# Patient Record
Sex: Female | Born: 1980 | Race: White | Hispanic: No | Smoking: Current every day smoker
Health system: Southern US, Community
[De-identification: ages and names within clinical notes are randomized; demographics above are authoritative.]

## PROBLEM LIST (undated history)

## (undated) DIAGNOSIS — R011 Cardiac murmur, unspecified: Secondary | ICD-10-CM

## (undated) DIAGNOSIS — I639 Cerebral infarction, unspecified: Secondary | ICD-10-CM

## (undated) DIAGNOSIS — I10 Essential (primary) hypertension: Secondary | ICD-10-CM

## (undated) HISTORY — DX: Cardiac murmur, unspecified: R01.1

## (undated) HISTORY — DX: Cerebral infarction, unspecified: I63.9

---

## 2004-07-22 ENCOUNTER — Emergency Department: Payer: Self-pay | Admitting: Unknown Physician Specialty

## 2006-10-25 ENCOUNTER — Emergency Department: Payer: Self-pay | Admitting: Internal Medicine

## 2006-11-27 ENCOUNTER — Emergency Department: Payer: Self-pay | Admitting: Emergency Medicine

## 2009-10-17 ENCOUNTER — Emergency Department: Payer: Self-pay | Admitting: Internal Medicine

## 2009-10-20 ENCOUNTER — Ambulatory Visit: Payer: Self-pay | Admitting: Gastroenterology

## 2011-04-02 ENCOUNTER — Emergency Department: Payer: Self-pay | Admitting: Emergency Medicine

## 2011-07-25 ENCOUNTER — Emergency Department: Payer: Self-pay | Admitting: Internal Medicine

## 2011-08-03 ENCOUNTER — Ambulatory Visit: Payer: Self-pay | Admitting: Specialist

## 2012-10-05 ENCOUNTER — Emergency Department: Payer: Medicaid Other | Admitting: Emergency Medicine

## 2012-10-05 LAB — CBC
MCH: 25.7 pg — ABNORMAL LOW (ref 26.0–34.0)
MCV: 79 fL — ABNORMAL LOW (ref 80–100)
Platelet: 306 10*3/uL (ref 150–440)
RDW: 15.2 % — ABNORMAL HIGH (ref 11.5–14.5)
WBC: 11.3 10*3/uL — ABNORMAL HIGH (ref 3.6–11.0)

## 2012-10-05 LAB — PRO B NATRIURETIC PEPTIDE: B-Type Natriuretic Peptide: 210 pg/mL — ABNORMAL HIGH (ref 0–125)

## 2012-10-05 LAB — BASIC METABOLIC PANEL
Anion Gap: 7 (ref 7–16)
Calcium, Total: 8.3 mg/dL — ABNORMAL LOW (ref 8.5–10.1)
Chloride: 105 mmol/L (ref 98–107)
Co2: 29 mmol/L (ref 21–32)
Creatinine: 0.82 mg/dL (ref 0.60–1.30)
EGFR (African American): >60

## 2015-03-21 ENCOUNTER — Emergency Department
Admission: EM | Admit: 2015-03-21 | Discharge: 2015-03-21 | Disposition: A | Discharge status: Home / Self Care | Payer: Medicaid Other | Attending: Emergency Medicine | Admitting: Emergency Medicine

## 2015-03-21 ENCOUNTER — Encounter: Payer: Self-pay | Admitting: Emergency Medicine

## 2015-03-21 ENCOUNTER — Emergency Department: Payer: Medicaid Other

## 2015-03-21 DIAGNOSIS — Z3202 Encounter for pregnancy test, result negative: Secondary | ICD-10-CM | POA: Diagnosis not present

## 2015-03-21 DIAGNOSIS — Z720 Tobacco use: Secondary | ICD-10-CM | POA: Insufficient documentation

## 2015-03-21 DIAGNOSIS — N12 Tubulo-interstitial nephritis, not specified as acute or chronic: Secondary | ICD-10-CM | POA: Insufficient documentation

## 2015-03-21 DIAGNOSIS — I10 Essential (primary) hypertension: Secondary | ICD-10-CM | POA: Insufficient documentation

## 2015-03-21 DIAGNOSIS — Z880 Allergy status to penicillin: Secondary | ICD-10-CM | POA: Insufficient documentation

## 2015-03-21 DIAGNOSIS — R109 Unspecified abdominal pain: Secondary | ICD-10-CM | POA: Diagnosis present

## 2015-03-21 HISTORY — DX: Essential (primary) hypertension: I10

## 2015-03-21 LAB — COMPREHENSIVE METABOLIC PANEL
ALT: 47 U/L (ref 14–54)
AST: 91 U/L — ABNORMAL HIGH (ref 15–41)
Albumin: 2.8 g/dL — ABNORMAL LOW (ref 3.5–5.0)
Alkaline Phosphatase: 104 U/L (ref 38–126)
Anion gap: 11 (ref 5–15)
BILIRUBIN TOTAL: 1.1 mg/dL (ref 0.3–1.2)
BUN: 26 mg/dL — ABNORMAL HIGH (ref 6–20)
CO2: 26 mmol/L (ref 22–32)
Calcium: 8.3 mg/dL — ABNORMAL LOW (ref 8.9–10.3)
Chloride: 93 mmol/L — ABNORMAL LOW (ref 101–111)
Creatinine, Ser: 1.42 mg/dL — ABNORMAL HIGH (ref 0.44–1.00)
GFR calc Af Amer: 56 mL/min — ABNORMAL LOW (ref >60)
GFR, EST NON AFRICAN AMERICAN: 48 mL/min — AB (ref >60)
Glucose, Bld: 82 mg/dL (ref 65–99)
Potassium: 2.3 mmol/L — CL (ref 3.5–5.1)
Sodium: 130 mmol/L — ABNORMAL LOW (ref 135–145)
TOTAL PROTEIN: 6.7 g/dL (ref 6.5–8.1)

## 2015-03-21 LAB — LIPASE, BLOOD: LIPASE: 23 U/L (ref 22–51)

## 2015-03-21 LAB — CBC WITH DIFFERENTIAL/PLATELET
BASOS ABS: 0 10*3/uL (ref 0–0.1)
Basophils Relative: 0 %
EOS PCT: 0 %
Eosinophils Absolute: 0 10*3/uL (ref 0–0.7)
HCT: 37.8 % (ref 35.0–47.0)
HEMOGLOBIN: 12.8 g/dL (ref 12.0–16.0)
Lymphocytes Relative: 9 %
Lymphs Abs: 1.1 10*3/uL (ref 1.0–3.6)
MCH: 32 pg (ref 26.0–34.0)
MCHC: 33.8 g/dL (ref 32.0–36.0)
MCV: 94.6 fL (ref 80.0–100.0)
MONOS PCT: 19 %
Monocytes Absolute: 2.2 10*3/uL — ABNORMAL HIGH (ref 0.2–0.9)
NEUTROS ABS: 8.5 10*3/uL — AB (ref 1.4–6.5)
Neutrophils Relative %: 72 %
Platelets: 139 10*3/uL — ABNORMAL LOW (ref 150–440)
RBC: 4 MIL/uL (ref 3.80–5.20)
RDW: 16.6 % — AB (ref 11.5–14.5)
WBC: 11.8 10*3/uL — ABNORMAL HIGH (ref 3.6–11.0)

## 2015-03-21 LAB — URINALYSIS COMPLETE WITH MICROSCOPIC (ARMC ONLY)
Bilirubin Urine: NEGATIVE
Glucose, UA: NEGATIVE mg/dL
Nitrite: NEGATIVE
Protein, ur: 30 mg/dL — AB
Specific Gravity, Urine: 1.016 (ref 1.005–1.030)
pH: 6 (ref 5.0–8.0)

## 2015-03-21 LAB — POCT PREGNANCY, URINE: Preg Test, Ur: NEGATIVE

## 2015-03-21 MED ORDER — POTASSIUM CHLORIDE 10 MEQ/100ML IV SOLN
10.0000 meq | Freq: Once | INTRAVENOUS | Status: AC
  Administered 2015-03-21: 10 meq via INTRAVENOUS
  Filled 2015-03-21: qty 100

## 2015-03-21 MED ORDER — OXYCODONE-ACETAMINOPHEN 5-325 MG PO TABS
2.0000 | ORAL_TABLET | Freq: Once | ORAL | Status: AC
  Administered 2015-03-21: 2 via ORAL

## 2015-03-21 MED ORDER — HYDROCODONE-ACETAMINOPHEN 5-325 MG PO TABS: 1.0000 | ORAL_TABLET | ORAL | Status: DC | PRN

## 2015-03-21 MED ORDER — POTASSIUM CHLORIDE CRYS ER 20 MEQ PO TBCR
EXTENDED_RELEASE_TABLET | ORAL | Status: AC
  Filled 2015-03-21: qty 2

## 2015-03-21 MED ORDER — SODIUM CHLORIDE 0.9 % IV BOLUS (SEPSIS)
1000.0000 mL | INTRAVENOUS | Status: AC
Start: 2015-03-21 — End: 2015-03-21
  Administered 2015-03-21: 1000 mL via INTRAVENOUS

## 2015-03-21 MED ORDER — OXYCODONE-ACETAMINOPHEN 5-325 MG PO TABS
ORAL_TABLET | ORAL | Status: AC
  Filled 2015-03-21: qty 2

## 2015-03-21 MED ORDER — POTASSIUM CHLORIDE CRYS ER 20 MEQ PO TBCR
40.0000 meq | EXTENDED_RELEASE_TABLET | Freq: Once | ORAL | Status: AC
  Administered 2015-03-21: 40 meq via ORAL

## 2015-03-21 MED ORDER — CEFTRIAXONE SODIUM IN DEXTROSE 40 MG/ML IV SOLN
2.0000 g | INTRAVENOUS | Status: AC
  Administered 2015-03-21: 2 g via INTRAVENOUS
  Filled 2015-03-21: qty 50

## 2015-03-21 MED ORDER — CEPHALEXIN 500 MG PO CAPS: 500.0000 mg | ORAL_CAPSULE | Freq: Three times a day (TID) | ORAL | Status: DC

## 2015-03-21 NOTE — Discharge Instructions (Signed)
Your workup today suggests that you have a urinary tract infection (UTI) which has spread to your right kidney.  Since the other antibiotic you were taking was unsuccessful, we switched you to another antibiotics and provided a gram of ceftriaxone IV by IV while in the emergency department.  Please take your antibiotic as prescribed and over-the-counter pain medication (Tylenol or Motrin) as needed, but no more than recommended on the label instructions.  Drink PLENTY of fluids.  Call your regular doctor to schedule the next available appointment to follow up on todays ED visit, or return immediately to the ED if your pain worsens, you have decreased urine production, develop fever, persistent vomiting, or other symptoms that concern you.   Pyelonephritis, Adult Pyelonephritis is a kidney infection. In general, there are 2 main types of pyelonephritis:  Infections that come on quickly without any warning (acute pyelonephritis).  Infections that persist for a long period of time (chronic pyelonephritis). CAUSES  Two main causes of pyelonephritis are:  Bacteria traveling from the bladder to the kidney. This is a problem especially in pregnant women. The urine in the bladder can become filled with bacteria from multiple causes, including:  Inflammation of the prostate gland (prostatitis).  Sexual intercourse in females.  Bladder infection (cystitis).  Bacteria traveling from the bloodstream to the tissue part of the kidney. Problems that may increase your risk of getting a kidney infection include:  Diabetes.  Kidney stones or bladder stones.  Cancer.  Catheters placed in the bladder.  Other abnormalities of the kidney or ureter. SYMPTOMS   Abdominal pain.  Pain in the side or flank area.  Fever.  Chills.  Upset stomach.  Blood in the urine (dark urine).  Frequent urination.  Strong or persistent urge to urinate.  Burning or stinging when urinating. DIAGNOSIS    Your caregiver may diagnose your kidney infection based on your symptoms. A urine sample may also be taken. TREATMENT  In general, treatment depends on how severe the infection is.   If the infection is mild and caught early, your caregiver may treat you with oral antibiotics and send you home.  If the infection is more severe, the bacteria may have gotten into the bloodstream. This will require intravenous (IV) antibiotics and a hospital stay. Symptoms may include:  High fever.  Severe flank pain.  Shaking chills.  Even after a hospital stay, your caregiver may require you to be on oral antibiotics for a period of time.  Other treatments may be required depending upon the cause of the infection. HOME CARE INSTRUCTIONS   Take your antibiotics as directed. Finish them even if you start to feel better.  Make an appointment to have your urine checked to make sure the infection is gone.  Drink enough fluids to keep your urine clear or pale yellow.  Take medicines for the bladder if you have urgency and frequency of urination as directed by your caregiver. SEEK IMMEDIATE MEDICAL CARE IF:   You have a fever or persistent symptoms for more than 2-3 days.  You have a fever and your symptoms suddenly get worse.  You are unable to take your antibiotics or fluids.  You develop shaking chills.  You experience extreme weakness or fainting.  There is no improvement after 2 days of treatment. MAKE SURE YOU:  Understand these instructions.  Will watch your condition.  Will get help right away if you are not doing well or get worse. Document Released: 10/01/2005 Document Revised: 04/01/2012 Document  Reviewed: 03/07/2011 ExitCare Patient Information 2015 [place]. This information is not intended to replace advice given to you by your health care provider. Make sure you discuss any questions you have with your health care provider.

## 2015-03-21 NOTE — ED Notes (Signed)
Pt ambulating independently w/ steady gait on d/c in no acute distress, A&Ox4. D/c instructions reviewed w/ pt and family - pt and family deny any further questions or concerns at present. Rx given x1  

## 2015-03-21 NOTE — ED Notes (Signed)
States had urine positive for infection 3 days, fevers at home

## 2015-03-21 NOTE — ED Notes (Signed)
Patient states she has right flank pain and was seen at her PCP on Friday and given antibiotics to treat a UTI.

## 2015-03-21 NOTE — ED Provider Notes (Signed)
[facility] Emergency Department Provider Note  ____________________________________________  Time seen: Approximately 6:11 PM  I have reviewed the triage vital signs and the nursing notes.   HISTORY  Chief Complaint Flank Pain    HPI Amy Frye is a 34 y.o. female with a history of about 4-5 days of right flank pain and subjective fever and chills for about the same time period.  The pain started in her right flank but also radiates to her suprapubic region.She does not specifically endorse dysuria.  She was seen by her primary care doctor 3 days ago and started on Cipro for a urinary tract infection.  She has felt no better since then and describes that her whole body hurts now and not just her flank and pelvis as described previously.  She describes the pain as severe and it waxes and wanes but never goes away completely.  She denies nausea, vomiting, chest pain, shortness of breath, or other abdominal pain.  She has not seen any blood in her urine.   Past Medical History  Diagnosis Date  . Hypertension     There are no active problems to display for this patient.   No past surgical history on file.    Allergies Penicillins and Sulfa antibiotics  (unknown reaction)   No family history on file.  Social History History  Substance Use Topics  . Smoking status: Current Every Day Smoker -- 1.00 packs/day    Types: Cigarettes  . Smokeless tobacco: Not on file  . Alcohol Use: 6.0 oz/week    10 Standard drinks or equivalent per week    Review of Systems Constitutional: Subjective fever/chills.  Describes pain all over her body. Eyes: No visual changes. ENT: No sore throat. Cardiovascular: Denies chest pain. Respiratory: Denies shortness of breath. Gastrointestinal: Suprapubic pain radiated from the right flank.  No nausea, no vomiting.  No diarrhea.  No constipation. Genitourinary: Negative for dysuria. Musculoskeletal: Right flank  pain Skin: Negative for rash. Neurological: Negative for headaches, focal weakness or numbness.  10-point ROS otherwise negative.  ____________________________________________   PHYSICAL EXAM:  VITAL SIGNS: ED Triage Vitals  Enc Vitals Group     BP 03/21/15 1638 117/70 mmHg     Pulse Rate 03/21/15 1638 100     Resp 03/21/15 1638 20     Temp 03/21/15 1638 98.6 F (37 C)     Temp Source 03/21/15 1638 Oral     SpO2 03/21/15 1638 100 %     Weight 03/21/15 1638 140 lb (63.504 kg)     Height 03/21/15 1638 5\' 8"  (1.727 m)     Head Cir --      Peak Flow --      Pain Score 03/21/15 1640 6     Pain Loc --      Pain Edu? --      Excl. in GC? --     Constitutional: Alert and oriented. Well appearing and in no acute distress, but appears uncomfortable Eyes: Conjunctivae are normal. PERRL. EOMI. Head: Atraumatic. Nose: No congestion/rhinnorhea. Mouth/Throat: Mucous membranes are moist.  Oropharynx non-erythematous. Neck: No stridor.   Cardiovascular: Borderline tachycardia, regular rhythm. Grossly normal heart sounds.  Good peripheral circulation. Respiratory: Normal respiratory effort.  No retractions. Lungs CTAB. Gastrointestinal: Soft and nontender. No distention. No abdominal bruits.  Moderate right CVA tenderness. Musculoskeletal: No lower extremity tenderness nor edema.  No joint effusions. Neurologic:  Normal speech and language. No gross focal neurologic deficits are appreciated. Speech is  normal. No gait instability. Skin:  Skin is warm, dry and intact. No rash noted. Psychiatric: Mood and affect are normal. Speech and behavior are normal.  ____________________________________________   LABS (all labs ordered are listed, but only abnormal results are displayed)  Labs Reviewed  CBC WITH DIFFERENTIAL/PLATELET - Abnormal; Notable for the following:    WBC 11.8 (*)    RDW 16.6 (*)    Platelets 139 (*)    Neutro Abs 8.5 (*)    Monocytes Absolute 2.2 (*)    All other  components within normal limits  COMPREHENSIVE METABOLIC PANEL - Abnormal; Notable for the following:    Sodium 130 (*)    Potassium 2.3 (*)    Chloride 93 (*)    BUN 26 (*)    Creatinine, Ser 1.42 (*)    Calcium 8.3 (*)    Albumin 2.8 (*)    AST 91 (*)    GFR calc non Af Amer 48 (*)    GFR calc Af Amer 56 (*)    All other components within normal limits  URINALYSIS COMPLETEWITH MICROSCOPIC ([facility] ONLY) - Abnormal; Notable for the following:    Color, Urine YELLOW (*)    APPearance HAZY (*)    Ketones, ur 1+ (*)    Hgb urine dipstick 1+ (*)    Protein, ur 30 (*)    Leukocytes, UA TRACE (*)    Bacteria, UA RARE (*)    Squamous Epithelial / LPF 6-30 (*)    All other components within normal limits  URINE CULTURE  LIPASE, BLOOD  POC URINE PREG, ED  POCT PREGNANCY, URINE   ____________________________________________  EKG  ED ECG REPORT I, Matalynn Graff, the attending physician, personally viewed and interpreted this ECG.   Date: 03/21/2015  EKG Time: 18:07  Rate: 90  Rhythm: normal sinus rhythm  Axis: Rightward axis  Intervals:none  ST&T Change: Approximately 1 cm of ST depression in lead V6, otherwise non-specific changes  ____________________________________________  RADIOLOGY  Ct Abdomen Pelvis Wo Contrast  03/21/2015   CLINICAL DATA:  Right-sided flank pain for 5 days  EXAM: CT ABDOMEN AND PELVIS WITHOUT CONTRAST  TECHNIQUE: Multidetector CT imaging of the abdomen and pelvis was performed following the standard protocol without IV contrast.  COMPARISON:  None.  FINDINGS: Lung bases are free of acute infiltrate or sizable effusion.  The liver, gallbladder, spleen, adrenal glands and pancreas are within normal limits. The left kidney shows some minimal fullness of the collecting system although the left ureter is within normal limits. Right kidney is enlarged and demonstrates perinephric stranding. Prominence of the collecting system and proximal ureter is seen. No  definitive calculus is identified. These changes may be related to edema from recently passed stone although the possibility of pyelonephritis would deserve consideration.  The appendix is well visualized and within normal limits. The bladder is well distended. No pelvic mass lesion is seen. The bony structures show no acute abnormality. The uterus and ovaries appear within normal limits.  IMPRESSION: Mildly enlarged right kidney with perinephric stranding and prominence of the collecting system proximally. No definitive calculus is seen. These changes may be related to a poorly calcified stone or edema from recently passed stone. Alternatively pyelonephritis could present in this manner as well.   Electronically Signed   By: Alcide Clever M.D.   On: 03/21/2015 18:54    ____________________________________________   INITIAL IMPRESSION / ASSESSMENT AND PLAN / ED COURSE  Pertinent labs & imaging results that were available during  my care of the patient were reviewed by me and considered in my medical decision making (see chart for details).  Given local resistance patterns, I suspect the patient has pollen of nephritis that was not covered by the outpatient Cipro.  I obtained a noncontrast CT abdomen and pelvis to evaluate for Prilosec but also to evaluate for possible stone.  There is no evidence of kidney stone or obstruction but the stranding and inflammation suggests pyelonephritis.  The patient is hemodynamically stable.  I will treat her with ceftriaxone 2 g IV and will discharge with a prescription for Keflex and Norco.  I advised close outpatient follow-up.  I also sent a urine culture.  ____________________________________________  FINAL CLINICAL IMPRESSION(S) / ED DIAGNOSES  Final diagnoses:  Pyelonephritis      NEW MEDICATIONS STARTED DURING THIS VISIT:  New Prescriptions   CEPHALEXIN (KEFLEX) 500 MG CAPSULE    Take 1 capsule (500 mg total) by mouth 3 (three) times daily.    HYDROCODONE-ACETAMINOPHEN (NORCO/VICODIN) 5-325 MG PER TABLET    Take 1-2 tablets by mouth every 4 (four) hours as needed for moderate pain.     Loleta Rose, MD 03/21/15 2037

## 2015-03-24 LAB — URINE CULTURE: Special Requests: NORMAL

## 2015-10-16 HISTORY — PX: BREAST SURGERY: SHX581

## 2017-07-25 DIAGNOSIS — D2261 Melanocytic nevi of right upper limb, including shoulder: Secondary | ICD-10-CM | POA: Diagnosis not present

## 2017-07-25 DIAGNOSIS — D485 Neoplasm of uncertain behavior of skin: Secondary | ICD-10-CM | POA: Diagnosis not present

## 2017-07-25 DIAGNOSIS — D2262 Melanocytic nevi of left upper limb, including shoulder: Secondary | ICD-10-CM | POA: Diagnosis not present

## 2017-07-25 DIAGNOSIS — D225 Melanocytic nevi of trunk: Secondary | ICD-10-CM | POA: Diagnosis not present

## 2017-07-25 DIAGNOSIS — L812 Freckles: Secondary | ICD-10-CM | POA: Diagnosis not present

## 2017-07-25 DIAGNOSIS — L700 Acne vulgaris: Secondary | ICD-10-CM | POA: Diagnosis not present

## 2018-08-27 DIAGNOSIS — L700 Acne vulgaris: Secondary | ICD-10-CM | POA: Diagnosis not present

## 2018-08-27 DIAGNOSIS — R21 Rash and other nonspecific skin eruption: Secondary | ICD-10-CM | POA: Diagnosis not present

## 2018-08-27 DIAGNOSIS — L309 Dermatitis, unspecified: Secondary | ICD-10-CM | POA: Diagnosis not present

## 2018-08-29 ENCOUNTER — Telehealth: Payer: Self-pay | Admitting: Obstetrics & Gynecology

## 2018-08-29 NOTE — Telephone Encounter (Signed)
Patient hasn't been seen in several years but had a standing prescription for something to take for cold sores.  Patient needs new prescription.

## 2018-09-01 ENCOUNTER — Other Ambulatory Visit: Payer: Self-pay | Admitting: Obstetrics and Gynecology

## 2018-09-01 DIAGNOSIS — B009 Herpesviral infection, unspecified: Secondary | ICD-10-CM

## 2018-09-01 MED ORDER — VALACYCLOVIR HCL 1 G PO TABS
1000.0000 mg | ORAL_TABLET | Freq: Every day | ORAL | 6 refills | Status: DC
Start: 2018-09-01 — End: 2018-11-20

## 2018-09-01 NOTE — Telephone Encounter (Signed)
[facility]

## 2018-09-01 NOTE — Telephone Encounter (Signed)
Pt needs an appointment before new Rx can be sent in. Please schedule does not have to be work in and not emergent.

## 2018-09-01 NOTE — Telephone Encounter (Signed)
Pt needs refill on medication for really bad cold sores.  She has scheduled appt for 12/23.  Can rx please be sent in?  Cannot wail a month like this.  [phone]

## 2018-09-01 NOTE — Telephone Encounter (Signed)
Can you send in a medication since rph is not in the office?

## 2018-09-01 NOTE — Telephone Encounter (Signed)
Completed, patient notified.

## 2018-09-03 ENCOUNTER — Ambulatory Visit: Payer: Self-pay | Admitting: Obstetrics and Gynecology

## 2018-09-16 DIAGNOSIS — Z889 Allergy status to unspecified drugs, medicaments and biological substances status: Secondary | ICD-10-CM | POA: Diagnosis not present

## 2018-09-16 DIAGNOSIS — I10 Essential (primary) hypertension: Secondary | ICD-10-CM | POA: Diagnosis not present

## 2018-09-16 DIAGNOSIS — R51 Headache: Secondary | ICD-10-CM | POA: Diagnosis not present

## 2018-09-16 DIAGNOSIS — J309 Allergic rhinitis, unspecified: Secondary | ICD-10-CM | POA: Diagnosis not present

## 2018-09-17 DIAGNOSIS — L700 Acne vulgaris: Secondary | ICD-10-CM | POA: Diagnosis not present

## 2018-09-17 DIAGNOSIS — S81809S Unspecified open wound, unspecified lower leg, sequela: Secondary | ICD-10-CM | POA: Diagnosis not present

## 2018-09-26 DIAGNOSIS — L700 Acne vulgaris: Secondary | ICD-10-CM | POA: Diagnosis not present

## 2018-09-26 DIAGNOSIS — Z79899 Other long term (current) drug therapy: Secondary | ICD-10-CM | POA: Diagnosis not present

## 2018-09-30 DIAGNOSIS — L700 Acne vulgaris: Secondary | ICD-10-CM | POA: Diagnosis not present

## 2018-10-06 ENCOUNTER — Ambulatory Visit: Payer: Medicaid Other | Admitting: Obstetrics & Gynecology

## 2018-10-17 ENCOUNTER — Ambulatory Visit: Payer: Medicaid Other | Admitting: Obstetrics & Gynecology

## 2018-10-21 ENCOUNTER — Ambulatory Visit: Payer: Medicaid Other | Admitting: Obstetrics and Gynecology

## 2018-10-21 ENCOUNTER — Ambulatory Visit: Payer: Medicaid Other | Admitting: Maternal Newborn

## 2018-10-22 ENCOUNTER — Ambulatory Visit: Payer: Medicaid Other | Admitting: Obstetrics and Gynecology

## 2018-10-24 ENCOUNTER — Encounter: Payer: Self-pay | Admitting: Advanced Practice Midwife

## 2018-10-24 ENCOUNTER — Other Ambulatory Visit (HOSPITAL_COMMUNITY)
Admission: RE | Admit: 2018-10-24 | Discharge: 2018-10-24 | Disposition: A | Discharge status: Home / Self Care | Payer: Medicaid Other | Source: Ambulatory Visit | Attending: Advanced Practice Midwife | Admitting: Advanced Practice Midwife

## 2018-10-24 ENCOUNTER — Ambulatory Visit: Payer: Medicaid Other | Admitting: Advanced Practice Midwife

## 2018-10-24 ENCOUNTER — Ambulatory Visit (INDEPENDENT_AMBULATORY_CARE_PROVIDER_SITE_OTHER): Payer: Medicaid Other | Admitting: Advanced Practice Midwife

## 2018-10-24 VITALS — BP 138/92 | HR 112 | Ht 68.0 in | Wt 172.0 lb

## 2018-10-24 DIAGNOSIS — Z01419 Encounter for gynecological examination (general) (routine) without abnormal findings: Secondary | ICD-10-CM | POA: Diagnosis present

## 2018-10-24 DIAGNOSIS — Z30430 Encounter for insertion of intrauterine contraceptive device: Secondary | ICD-10-CM | POA: Diagnosis not present

## 2018-10-24 DIAGNOSIS — Z113 Encounter for screening for infections with a predominantly sexual mode of transmission: Secondary | ICD-10-CM | POA: Insufficient documentation

## 2018-10-24 DIAGNOSIS — Z124 Encounter for screening for malignant neoplasm of cervix: Secondary | ICD-10-CM

## 2018-10-24 DIAGNOSIS — Z0000 Encounter for general adult medical examination without abnormal findings: Secondary | ICD-10-CM

## 2018-10-24 NOTE — Progress Notes (Signed)
Patient ID: Amy Frye, female   DOB: 1981/02/24, 38 y.o.   MRN: [id number]     Gynecology Annual Exam   PCP: Margaretann Loveless, MD  Chief Complaint:  Chief Complaint  Patient presents with  . Gynecologic Exam    Discuss new OCP    History of Present Illness: Patient is a 38 y.o. G1P1 presents for annual exam. The patient has complaint today of possible yeast infection. She denies itching or irritation but recently had an increased amount of thin white discharge. She denies odor or s/s of uti. She is going to start a new acne medication and has been informed that she needs a reliable form of birth control. Discussed the options given that she is a smoker and has had a stroke per her report. She has decided that she would like Mirena IUD.    LMP: Patient's last menstrual period was 10/10/2018. Average Interval: regular, every 2 months  Duration of flow: 6 days Heavy Menses: no Clots: no Intermenstrual Bleeding: some spotting Postcoital Bleeding: no Dysmenorrhea: no  The patient is not currently sexually active. She currently uses oral progesterone-only contraceptive for contraception. She denies dyspareunia.  The patient does perform self breast exams. She had breast augmentation surgery in the past 2 years. There is no notable family history of breast or ovarian cancer in her family.  The patient wears seatbelts: yes.   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  She admits trying to eat healthy. She drinks some water and also energy drinks that are artificially sweetened.   The patient denies current symptoms of depression.    Review of Systems: Review of Systems  Constitutional: Negative.   HENT: Negative.   Eyes: Negative.   Respiratory: Negative.   Cardiovascular: Negative.   Gastrointestinal: Negative.   Genitourinary:       Discharge   Musculoskeletal: Negative.   Skin: Negative.   Neurological: Negative.   Endo/Heme/Allergies:  Negative.   Psychiatric/Behavioral: Negative.     Past Medical History:  Past Medical History:  Diagnosis Date  . Hypertension     Past Surgical History:  Past Surgical History:  Procedure Laterality Date  . BREAST SURGERY  2017   Lift/ Enhancement bilateral  . CESAREAN SECTION  04/10/2003    Gynecologic History:  Patient's last menstrual period was 10/10/2018. Contraception: oral progesterone-only contraceptive Last Pap: 2013 Results were: no abnormalities  Obstetric History: G1P1  Family History:  Family History  Adopted: Yes    Social History:  Social History   Socioeconomic History  . Marital status: Single    Spouse name: Not on file  . Number of children: Not on file  . Years of education: Not on file  . Highest education level: Not on file  Occupational History  . Not on file  Social Needs  . Financial resource strain: Not on file  . Food insecurity:    Worry: Not on file    Inability: Not on file  . Transportation needs:    Medical: Not on file    Non-medical: Not on file  Tobacco Use  . Smoking status: Current Every Day Smoker    Packs/day: 1.00    Types: Cigarettes  . Smokeless tobacco: Never Used  Substance and Sexual Activity  . Alcohol use: Yes    Alcohol/week: 10.0 standard drinks    Types: 10 Standard drinks or equivalent per week    Comment: occ  . Drug use: Not Currently  .  Sexual activity: Not Currently    Partners: Male    Birth control/protection: Pill  Lifestyle  . Physical activity:    Days per week: Not on file    Minutes per session: Not on file  . Stress: Not on file  Relationships  . Social connections:    Talks on phone: Not on file    Gets together: Not on file    Attends religious service: Not on file    Active member of club or organization: Not on file    Attends meetings of clubs or organizations: Not on file    Relationship status: Not on file  . Intimate partner violence:    Fear of current or ex partner:  Not on file    Emotionally abused: Not on file    Physically abused: Not on file    Forced sexual activity: Not on file  Other Topics Concern  . Not on file  Social History Narrative  . Not on file    Allergies:  Allergies  Allergen Reactions  . Penicillins Other (See Comments)    unknown  . Sulfa Antibiotics Swelling    Medications: Prior to Admission medications   Medication Sig Start Date End Date Taking? Authorizing Provider  doxycycline (VIBRA-TABS) 100 MG tablet Take by mouth.   Yes [provider]  hydrochlorothiazide (MICROZIDE) 12.5 MG capsule Take 12.5 mg by mouth daily.   Yes [provider]    Physical Exam Vitals: Blood pressure (!) 138/92, pulse (!) 112, height 5\' 8"  (1.727 m), weight 172 lb (78 kg), last menstrual period 10/10/2018.  General: NAD HEENT: normocephalic, anicteric Thyroid: no enlargement, no palpable nodules Pulmonary: No increased work of breathing, CTAB Cardiovascular: RRR, distal pulses 2+ Breast: Breast symmetrical, no tenderness, no palpable nodules or masses, no skin or nipple retraction present, no nipple discharge.  No axillary or supraclavicular lymphadenopathy. Abdomen: NABS, soft, non-tender, non-distended.  Umbilicus without lesions.  No hepatomegaly, splenomegaly or masses palpable. No evidence of hernia  Genitourinary:  External: Normal external female genitalia.  Normal urethral meatus, normal Bartholin's and Skene's glands.    Vagina: Normal vaginal mucosa, no evidence of prolapse, no discharge seen.    Cervix: Grossly normal in appearance, bleeding seen  Uterus: Non-enlarged, mobile, normal contour.  No CMT  Adnexa: ovaries non-enlarged, no adnexal masses  Rectal: deferred  Lymphatic: no evidence of inguinal lymphadenopathy Extremities: no edema, erythema, or tenderness Neurologic: Grossly intact Psychiatric: mood appropriate, affect full    Assessment: 38 y.o. G1P1 routine annual exam  Plan: Problem  List Items Addressed This Visit    None    Visit Diagnoses    Well woman exam with routine gynecological exam    -  Primary   Relevant Orders   Cytology - PAP   STD Panel   Screen for sexually transmitted diseases       Relevant Orders   Cytology - PAP   STD Panel   Cervical cancer screening       Relevant Orders   Cytology - PAP   Encounter for insertion of mirena IUD          1) STI screening  was offered and accepted  2)  ASCCP guidelines and rational discussed.  Patient opts for every 3 years screening interval  3) Contraception - the patient is currently using  oral progesterone-only contraceptive.  She is interested in changing to Mirena IUD  4) Routine healthcare maintenance including cholesterol, diabetes screening discussed Declines  5) Return  in 4 weeks (on 11/21/2018) for IUD string check 4 weeks and lab only visit for STD panel   Tresea Mall, CNM [facility], [facility] 10/24/2018, 4:45 PM     GYNECOLOGY OFFICE PROCEDURE NOTE  Amy Frye is a 38 y.o. G1P1 here for Mirena IUD insertion. No GYN concerns.  Last pap smear was 2013 and was normal.  The patient is currently using Camila/abstinence for contraception and her LMP is Patient's last menstrual period was 10/10/2018.Marland Kitchen  The indication for her IUD is contraception/cycle control.  IUD Insertion Procedure Note Patient identified, informed consent performed, consent signed.   Discussed risks of irregular bleeding, cramping, infection, malpositioning, expulsion or uterine perforation of the IUD (1:1000 placements)  which may require further procedure such as laparoscopy.  IUD while effective at preventing pregnancy do not prevent transmission of sexually transmitted diseases and use of barrier methods for this purpose was discussed. Time out was performed.  Urine pregnancy test negative.  Speculum placed in the vagina.  Cervix visualized.  Cleaned with Betadine x 2.  Grasped anteriorly with  a single tooth tenaculum.  Uterus sounded to 7.5 cm. IUD placed per manufacturer's recommendations.  Strings trimmed to 3 cm. Tenaculum was removed, good hemostasis noted.  Patient tolerated procedure well.   Patient was given post-procedure instructions.  She was advised to have backup contraception for one week.  Patient was also asked to check IUD strings periodically and follow up in 4-6 weeks for IUD check.  IUD insertion CPT 58300,  Skyla [id number] Mirena [id number] Liletta [id number] Paraguard [id number] Rutha Bouchard [phone] Modifer 25, plus Modifer 79 is done during a global billing visit    Tresea Mall, CNM [facility], [facility]

## 2018-10-24 NOTE — Patient Instructions (Addendum)
Mediterranean Diet A Mediterranean diet refers to food and lifestyle choices that are based on the traditions of countries located on the [employer]. This way of eating has been shown to help prevent certain conditions and improve outcomes for people who have chronic diseases, like kidney disease and heart disease. What are tips for following this plan? Lifestyle  Cook and eat meals together with your family, when possible.  Drink enough fluid to keep your urine clear or pale yellow.  Be physically active every day. This includes: ? Aerobic exercise like running or swimming. ? Leisure activities like gardening, walking, or housework.  Get 7-8 hours of sleep each night.  If recommended by your health care provider, drink red wine in moderation. This means 1 glass a day for nonpregnant women and 2 glasses a day for men. A glass of wine equals 5 oz (150 mL). Reading food labels   Check the serving size of packaged foods. For foods such as rice and pasta, the serving size refers to the amount of cooked product, not dry.  Check the total fat in packaged foods. Avoid foods that have saturated fat or trans fats.  Check the ingredients list for added sugars, such as corn syrup. Shopping  At the grocery store, buy most of your food from the areas near the walls of the store. This includes: ? Fresh fruits and vegetables (produce). ? Grains, beans, nuts, and seeds. Some of these may be available in unpackaged forms or large amounts (in bulk). ? Fresh seafood. ? Poultry and eggs. ? Low-fat dairy products.  Buy whole ingredients instead of prepackaged foods.  Buy fresh fruits and vegetables in-season from local farmers markets.  Buy frozen fruits and vegetables in resealable bags.  If you do not have access to quality fresh seafood, buy precooked frozen shrimp or canned fish, such as tuna, salmon, or sardines.  Buy small amounts of raw or cooked vegetables, salads, or olives from  the deli or salad bar at your store.  Stock your pantry so you always have certain foods on hand, such as olive oil, canned tuna, canned tomatoes, rice, pasta, and beans. Cooking  Cook foods with extra-virgin olive oil instead of using butter or other vegetable oils.  Have meat as a side dish, and have vegetables or grains as your main dish. This means having meat in small portions or adding small amounts of meat to foods like pasta or stew.  Use beans or vegetables instead of meat in common dishes like chili or lasagna.  Experiment with different cooking methods. Try roasting or broiling vegetables instead of steaming or sauteing them.  Add frozen vegetables to soups, stews, pasta, or rice.  Add nuts or seeds for added healthy fat at each meal. You can add these to yogurt, salads, or vegetable dishes.  Marinate fish or vegetables using olive oil, lemon juice, garlic, and fresh herbs. Meal planning   Plan to eat 1 vegetarian meal one day each week. Try to work up to 2 vegetarian meals, if possible.  Eat seafood 2 or more times a week.  Have healthy snacks readily available, such as: ? Vegetable sticks with hummus. ? [place] yogurt. ? Fruit and nut trail mix.  Eat balanced meals throughout the week. This includes: ? Fruit: 2-3 servings a day ? Vegetables: 4-5 servings a day ? Low-fat dairy: 2 servings a day ? Fish, poultry, or lean meat: 1 serving a day ? Beans and legumes: 2 or more servings a week ?  Nuts and seeds: 1-2 servings a day ? Whole grains: 6-8 servings a day ? Extra-virgin olive oil: 3-4 servings a day  Limit red meat and sweets to only a few servings a month What are my food choices?  Mediterranean diet ? Recommended ? Grains: Whole-grain pasta. Brown rice. Bulgar wheat. Polenta. Couscous. Whole-wheat bread. [place]. ? Vegetables: Artichokes. Beets. Broccoli. Cabbage. Carrots. Eggplant. Green beans. Chard. Kale. Spinach. Onions. Leeks. Peas. Squash.  Tomatoes. Peppers. Radishes. ? Fruits: Apples. Apricots. Avocado. Berries. Bananas. Cherries. Dates. Figs. Grapes. Lemons. Melon. Oranges. Peaches. Plums. Pomegranate. ? Meats and other protein foods: Beans. Almonds. Sunflower seeds. Pine nuts. Peanuts. [place]. Salmon. Scallops. Shrimp. [place]. Tilapia. Clams. Oysters. Eggs. ? Dairy: Low-fat milk. Cheese. Greek yogurt. ? Beverages: Water. Red wine. Herbal tea. ? Fats and oils: Extra virgin olive oil. Avocado oil. Grape seed oil. ? Sweets and desserts: [place] yogurt with honey. Baked apples. Poached pears. Trail mix. ? Seasoning and other foods: Basil. Cilantro. Coriander. Cumin. Mint. Parsley. Sage. Rosemary. Tarragon. Garlic. Oregano. Thyme. Pepper. Balsalmic vinegar. Tahini. Hummus. Tomato sauce. Olives. Mushrooms. ? Limit these ? Grains: Prepackaged pasta or rice dishes. Prepackaged cereal with added sugar. ? Vegetables: Deep fried potatoes (french fries). ? Fruits: Fruit canned in syrup. ? Meats and other protein foods: Beef. Pork. Lamb. Poultry with skin. Hot dogs. Berniece Salines. ? Dairy: Ice cream. Sour cream. Whole milk. ? Beverages: Juice. Sugar-sweetened soft drinks. Beer. Liquor and spirits. ? Fats and oils: Butter. Canola oil. Vegetable oil. Beef fat (tallow). Lard. ? Sweets and desserts: Cookies. Cakes. Pies. Candy. ? Seasoning and other foods: Mayonnaise. Premade sauces and marinades. ? The items listed may not be a complete list. Talk with your dietitian about what dietary choices are right for you. Summary  The Mediterranean diet includes both food and lifestyle choices.  Eat a variety of fresh fruits and vegetables, beans, nuts, seeds, and whole grains.  Limit the amount of red meat and sweets that you eat.  Talk with your health care provider about whether it is safe for you to drink red wine in moderation. This means 1 glass a day for nonpregnant women and 2 glasses a day for men. A glass of wine equals 5 oz (150 mL). This information  is not intended to replace advice given to you by your health care provider. Make sure you discuss any questions you have with your health care provider. Document Released: 05/24/2016 Document Revised: 06/26/2016 Document Reviewed: 05/24/2016 Elsevier Interactive Patient Education  2019 [facility]) Exercise Recommendation  Being physically active is important to prevent heart disease and stroke, the nation's No. 1and No. 5killers. To improve overall cardiovascular health, we suggest at least 150 minutes per week of moderate exercise or 75 minutes per week of vigorous exercise (or a combination of moderate and vigorous activity). Thirty minutes a day, five times a week is an easy goal to remember. You will also experience benefits even if you divide your time into two or three segments of 10 to 15 minutes per day.  For people who would benefit from lowering their blood pressure or cholesterol, we recommend 40 minutes of aerobic exercise of moderate to vigorous intensity three to four times a week to lower the risk for heart attack and stroke.  Physical activity is anything that makes you move your body and burn calories.  This includes things like climbing stairs or playing sports. Aerobic exercises benefit your heart, and include walking, jogging, swimming or biking. Strength  and stretching exercises are best for overall stamina and flexibility.  The simplest, positive change you can make to effectively improve your heart health is to start walking. It's enjoyable, free, easy, social and great exercise. A walking program is flexible and boasts high success rates because people can stick with it. It's easy for walking to become a regular and satisfying part of life.   For Overall Cardiovascular Health:  At least 30 minutes of moderate-intensity aerobic activity at least 5 days per week for a total of 150  OR   At least 25 minutes of vigorous aerobic activity  at least 3 days per week for a total of 75 minutes; or a combination of moderate- and vigorous-intensity aerobic activity  AND   Moderate- to high-intensity muscle-strengthening activity at least 2 days per week for additional health benefits.  For Lowering Blood Pressure and Cholesterol  An average 40 minutes of moderate- to vigorous-intensity aerobic activity 3 or 4 times per week  What if I can't make it to the time goal? Something is always better than nothing! And everyone has to start somewhere. Even if you've been sedentary for years, today is the day you can begin to make healthy changes in your life. If you don't think you'll make it for 30 or 40 minutes, set a reachable goal for today. You can work up toward your overall goal by increasing your time as you get stronger. Don't let all-or-nothing thinking rob you of doing what you can every day.  Source:http://www.heart.org     Preventive Care 18-39 Years, Female Preventive care refers to lifestyle choices and visits with your health care provider that can promote health and wellness. What does preventive care include?   A yearly physical exam. This is also called an annual well check.  Dental exams once or twice a year.  Routine eye exams. Ask your health care provider how often you should have your eyes checked.  Personal lifestyle choices, including: ? Daily care of your teeth and gums. ? Regular physical activity. ? Eating a healthy diet. ? Avoiding tobacco and drug use. ? Limiting alcohol use. ? Practicing safe sex. ? Taking vitamin and mineral supplements as recommended by your health care provider. What happens during an annual well check? The services and screenings done by your health care provider during your annual well check will depend on your age, overall health, lifestyle risk factors, and family history of disease. Counseling Your health care provider may ask you questions about your:  Alcohol  use.  Tobacco use.  Drug use.  Emotional well-being.  Home and relationship well-being.  Sexual activity.  Eating habits.  Work and work Statistician.  Method of birth control.  Menstrual cycle.  Pregnancy history. Screening You may have the following tests or measurements:  Height, weight, and BMI.  Diabetes screening. This is done by checking your blood sugar (glucose) after you have not eaten for a while (fasting).  Blood pressure.  Lipid and cholesterol levels. These may be checked every 5 years starting at age 49.  Skin check.  Hepatitis C blood test.  Hepatitis B blood test.  Sexually transmitted disease (STD) testing.  BRCA-related cancer screening. This may be done if you have a family history of breast, ovarian, tubal, or peritoneal cancers.  Pelvic exam and Pap test. This may be done every 3 years starting at age 1. Starting at age 17, this may be done every 5 years if you have a Pap test in combination  with an HPV test. Discuss your test results, treatment options, and if necessary, the need for more tests with your health care provider. Vaccines Your health care provider may recommend certain vaccines, such as:  Influenza vaccine. This is recommended every year.  Tetanus, diphtheria, and acellular pertussis (Tdap, Td) vaccine. You may need a Td booster every 10 years.  Varicella vaccine. You may need this if you have not been vaccinated.  HPV vaccine. If you are 56 or younger, you may need three doses over 6 months.  Measles, mumps, and rubella (MMR) vaccine. You may need at least one dose of MMR. You may also need a second dose.  Pneumococcal 13-valent conjugate (PCV13) vaccine. You may need this if you have certain conditions and were not previously vaccinated.  Pneumococcal polysaccharide (PPSV23) vaccine. You may need one or two doses if you smoke cigarettes or if you have certain conditions.  Meningococcal vaccine. One dose is recommended  if you are age 70-21 years and a first-year college student living in a residence hall, or if you have one of several medical conditions. You may also need additional booster doses.  Hepatitis A vaccine. You may need this if you have certain conditions or if you travel or work in places where you may be exposed to hepatitis A.  Hepatitis B vaccine. You may need this if you have certain conditions or if you travel or work in places where you may be exposed to hepatitis B.  Haemophilus influenzae type b (Hib) vaccine. You may need this if you have certain risk factors. Talk to your health care provider about which screenings and vaccines you need and how often you need them. This information is not intended to replace advice given to you by your health care provider. Make sure you discuss any questions you have with your health care provider. Document Released: 11/27/2001 Document Revised: 05/14/2017 Document Reviewed: 08/02/2015 Elsevier Interactive Patient Education  2019 [employer].

## 2018-10-27 ENCOUNTER — Encounter: Payer: Self-pay | Admitting: Obstetrics and Gynecology

## 2018-10-27 ENCOUNTER — Ambulatory Visit (INDEPENDENT_AMBULATORY_CARE_PROVIDER_SITE_OTHER): Payer: Medicaid Other | Admitting: Obstetrics and Gynecology

## 2018-10-27 ENCOUNTER — Other Ambulatory Visit: Payer: Medicaid Other

## 2018-10-27 VITALS — BP 130/90 | HR 103 | Ht 68.0 in | Wt 170.0 lb

## 2018-10-27 DIAGNOSIS — Z975 Presence of (intrauterine) contraceptive device: Secondary | ICD-10-CM | POA: Diagnosis not present

## 2018-10-27 DIAGNOSIS — R102 Pelvic and perineal pain: Secondary | ICD-10-CM | POA: Diagnosis not present

## 2018-10-27 DIAGNOSIS — N921 Excessive and frequent menstruation with irregular cycle: Secondary | ICD-10-CM | POA: Diagnosis not present

## 2018-10-27 NOTE — Patient Instructions (Signed)
I value your feedback and entrusting us with your care. If you get a [facility] patient survey, I would appreciate you taking the time to let us know about your experience today. Thank you! 

## 2018-10-27 NOTE — Progress Notes (Signed)
Amy Loveless, MD   Chief Complaint  Patient presents with  . Pelvic Pain    is having pelvic pain since IUD insertion, at first pain was shooting down leg, now pain is dull, not constant, and lower back pain  . Contraception    would like IUD removed and get Nexplanon    HPI:      Amy Frye is a 38 y.o. G1P1 who LMP was Patient's last menstrual period was 10/10/2018 (approximate)., presents today for pain and bleeding with IUD. Mirena placed 10/24/18 for [facility] for future accutane use, not sex active. POPs were not sufficient. Pt had sharp shooting pains, radiating down leg, after placement that have eased off and are now dull, episodic pains. Pt can't take NSAIDs but has tried tylenol with minimal improvement. Has had some bleeding/spotting since placement too, although that is improving, too. Pt is not sex active, pap/STD testing results from 10/24/18 pending.  Pt with HTN.   Past Medical History:  Diagnosis Date  . Hypertension     Past Surgical History:  Procedure Laterality Date  . BREAST SURGERY  2017   Lift/ Enhancement bilateral  . CESAREAN SECTION  04/10/2003    Family History  Adopted: Yes    Social History   Socioeconomic History  . Marital status: Single    Spouse name: Not on file  . Number of children: Not on file  . Years of education: Not on file  . Highest education level: Not on file  Occupational History  . Not on file  Social Needs  . Financial resource strain: Not on file  . Food insecurity:    Worry: Not on file    Inability: Not on file  . Transportation needs:    Medical: Not on file    Non-medical: Not on file  Tobacco Use  . Smoking status: Current Every Day Smoker    Packs/day: 1.00    Types: Cigarettes  . Smokeless tobacco: Never Used  Substance and Sexual Activity  . Alcohol use: Yes    Alcohol/week: 10.0 standard drinks    Types: 10 Standard drinks or equivalent per week    Comment: occ  . Drug use: Not Currently  .  Sexual activity: Not Currently    Partners: Male    Birth control/protection: I.U.D.    Comment: Mirena  Lifestyle  . Physical activity:    Days per week: Not on file    Minutes per session: Not on file  . Stress: Not on file  Relationships  . Social connections:    Talks on phone: Not on file    Gets together: Not on file    Attends religious service: Not on file    Active member of club or organization: Not on file    Attends meetings of clubs or organizations: Not on file    Relationship status: Not on file  . Intimate partner violence:    Fear of current or ex partner: Not on file    Emotionally abused: Not on file    Physically abused: Not on file    Forced sexual activity: Not on file  Other Topics Concern  . Not on file  Social History Narrative  . Not on file    Outpatient Medications Prior to Visit  Medication Sig Dispense Refill  . doxycycline (VIBRA-TABS) 100 MG tablet Take by mouth.    . hydrochlorothiazide (MICROZIDE) 12.5 MG capsule Take 12.5 mg by mouth daily.    Marland Kitchen  levonorgestrel (MIRENA) 20 MCG/24HR IUD 1 each by Intrauterine route once.    . phentermine (ADIPEX-P) 37.5 MG tablet Take 37.5 mg by mouth daily before breakfast.     No facility-administered medications prior to visit.       ROS:  Review of Systems  Constitutional: Negative for fever.  Gastrointestinal: Negative for blood in stool, constipation, diarrhea, nausea and vomiting.  Genitourinary: Positive for pelvic pain, vaginal bleeding and vaginal discharge. Negative for dyspareunia, dysuria, flank pain, frequency, hematuria, urgency and vaginal pain.  Musculoskeletal: Negative for back pain.  Skin: Negative for rash.     OBJECTIVE:   Vitals:  BP 130/90   Pulse (!) 103   Ht 5\' 8"  (1.727 m)   Wt 170 lb (77.1 kg)   LMP 10/10/2018 (Approximate)   BMI 25.85 kg/m   Physical Exam Vitals signs reviewed.  Constitutional:      Appearance: She is well-developed.  Pulmonary:      Effort: Pulmonary effort is normal.  Genitourinary:    Pubic Area: No rash.      Labia:        Right: No rash, tenderness or lesion.        Left: No rash, tenderness or lesion.      Vagina: Normal. No vaginal discharge, erythema or tenderness.     Cervix: No cervical bleeding.     Uterus: Normal. Tender. Not enlarged.      Adnexa: Right adnexa normal and left adnexa normal.       Right: No mass or tenderness.         Left: No mass or tenderness.       Comments: IUD STRINGS IN PLACE; NO BLEEDING ON EXAM Musculoskeletal: Normal range of motion.  Neurological:     Mental Status: She is alert and oriented to person, place, and time.  Psychiatric:        Behavior: Behavior normal.        Thought Content: Thought content normal.     Assessment/Plan: Pelvic pain  Breakthrough bleeding with IUD  After IUD placement. Slight tenderness on exam. Discussed GYN u/s vs watch/wait since sx improving. Pt elects to give it more time for sx to improve. If sx persist, will check u/s for placement. Tylenol/heating pad prn sx (can't take NSAIDs). BTB normal. Discussed BTB with nexplanon as well. Pt needs to give IUD time to work before changing to [employer]planon for insurance purposes as well (discussed with Amy Frye).      Return if symptoms worsen or fail to improve.  Alicia B. Copland, PA-C 10/27/2018 4:44 PM

## 2018-10-28 LAB — CYTOLOGY - PAP
CHLAMYDIA, DNA PROBE: NEGATIVE
Diagnosis: NEGATIVE
HPV (WINDOPATH): NOT DETECTED
Neisseria Gonorrhea: NEGATIVE
TRICH (WINDOWPATH): NEGATIVE

## 2018-10-29 ENCOUNTER — Other Ambulatory Visit: Payer: Medicaid Other

## 2018-10-29 DIAGNOSIS — Z113 Encounter for screening for infections with a predominantly sexual mode of transmission: Secondary | ICD-10-CM

## 2018-10-29 DIAGNOSIS — Z01419 Encounter for gynecological examination (general) (routine) without abnormal findings: Secondary | ICD-10-CM

## 2018-10-30 LAB — CERVICOVAGINAL ANCILLARY ONLY: HERPES (WINDOWPATH): NEGATIVE

## 2018-10-31 LAB — RPR+HSVIGM+HBSAG+HSV2(IGG)+...
HIV Screen 4th Generation wRfx: NONREACTIVE
HSV 2 IgG, Type Spec: <0.91 index (ref 0.00–0.90)
Hepatitis B Surface Ag: NEGATIVE
RPR Ser Ql: NONREACTIVE

## 2018-11-05 DIAGNOSIS — L700 Acne vulgaris: Secondary | ICD-10-CM | POA: Diagnosis not present

## 2018-11-05 DIAGNOSIS — Z79899 Other long term (current) drug therapy: Secondary | ICD-10-CM | POA: Diagnosis not present

## 2018-11-07 DIAGNOSIS — L700 Acne vulgaris: Secondary | ICD-10-CM | POA: Diagnosis not present

## 2018-11-07 DIAGNOSIS — Z79899 Other long term (current) drug therapy: Secondary | ICD-10-CM | POA: Diagnosis not present

## 2018-11-18 ENCOUNTER — Other Ambulatory Visit: Payer: Self-pay | Admitting: Obstetrics and Gynecology

## 2018-11-18 DIAGNOSIS — B009 Herpesviral infection, unspecified: Secondary | ICD-10-CM

## 2018-11-25 ENCOUNTER — Ambulatory Visit: Payer: Medicaid Other | Admitting: Obstetrics and Gynecology

## 2018-11-25 ENCOUNTER — Ambulatory Visit: Payer: Medicaid Other | Admitting: Advanced Practice Midwife

## 2018-11-26 ENCOUNTER — Encounter: Payer: Self-pay | Admitting: Obstetrics and Gynecology

## 2018-11-26 ENCOUNTER — Ambulatory Visit (INDEPENDENT_AMBULATORY_CARE_PROVIDER_SITE_OTHER): Payer: Medicaid Other | Admitting: Obstetrics and Gynecology

## 2018-11-26 VITALS — BP 140/80 | HR 105 | Ht 68.0 in | Wt 170.0 lb

## 2018-11-26 DIAGNOSIS — Z30017 Encounter for initial prescription of implantable subdermal contraceptive: Secondary | ICD-10-CM

## 2018-11-26 DIAGNOSIS — Z30432 Encounter for removal of intrauterine contraceptive device: Secondary | ICD-10-CM

## 2018-11-26 MED ORDER — ETONOGESTREL 68 MG ~~LOC~~ IMPL: 68.0000 mg | DRUG_IMPLANT | Freq: Once | SUBCUTANEOUS | Status: DC

## 2018-11-26 NOTE — Patient Instructions (Signed)
I value your feedback and entrusting us with your care. If you get a [facility] patient survey, I would appreciate you taking the time to let us know about your experience today. Thank you!  Remove the dressing in 24 hours,  keep the incision area dry for 24 hours and remove the Steristrip in 2-3  days.  Notify us if any signs of tenderness, redness, pain, or fevers develop.  

## 2018-11-26 NOTE — Progress Notes (Signed)
   Chief Complaint  Patient presents with  . IUD Check    pelvic pain is less, wants it removed to some cramping, and still bleeding since insertion     History of Present Illness:  Amy Frye is a 38 y.o. that had a Mirena IUD placed approximately 1 months ago. She has had cramping/spotting since insertion. Not happy with it. She would like to try nexplanon. She is on accutane and has to have approved BC. Not currently sex active. She has HTN/tob use and can't have estrogen products, but camila not on accutane approved list. Not sure if Slynd is on list since so new. Pt only has to be on accutane a couple more months. Not interested in depo.  Current on annual.   BP 140/80   Pulse (!) 105   Ht 5\' 8"  (1.727 m)   Wt 170 lb (77.1 kg)   BMI 25.85 kg/m   Pelvic exam:  Two IUD strings present seen coming from the cervical os. EGBUS, vaginal vault and cervix: within normal limits  IUD Removal Strings of IUD identified and grasped.  IUD removed without problem with ring forceps.  Pt tolerated this well.  IUD noted to be intact.   Nexplanon Insertion  Patient given informed consent, signed copy in the chart, time out was performed.  Appropriate time out taken.  Patient's LEFTarm was prepped and draped in the usual sterile fashion. The ruler used to measure and mark insertion area.  Pt was prepped with betadine swab and then injected with 1.0 cc of 2% lidocaine with epinephrine. Nexplanon removed form packaging,  Device confirmed in needle, then inserted full length of needle and withdrawn per handbook instructions.  Pt insertion site covered with steri-strip and a bandage.   Minimal blood loss.  Pt tolerated the procedure welL.  Assessment: Encounter for IUD removal  Nexplanon insertion - Plan: etonogestrel (NEXPLANON) implant 68 mg   Meds ordered this encounter  Medications  . etonogestrel (NEXPLANON) implant 68 mg     Plan:   She was told to remove the dressing in 12-24  hours, to keep the incision area dry for 24 hours and to remove the Steristrip in 2-3  days.  Notify us if any signs of tenderness, redness, pain, or fevers develop.   Alicia B. Copland, PA-C 11/26/2018 4:45 PM

## 2018-12-11 DIAGNOSIS — L309 Dermatitis, unspecified: Secondary | ICD-10-CM | POA: Diagnosis not present

## 2018-12-11 DIAGNOSIS — L700 Acne vulgaris: Secondary | ICD-10-CM | POA: Diagnosis not present

## 2018-12-11 DIAGNOSIS — Z79899 Other long term (current) drug therapy: Secondary | ICD-10-CM | POA: Diagnosis not present

## 2019-01-15 DIAGNOSIS — L853 Xerosis cutis: Secondary | ICD-10-CM | POA: Diagnosis not present

## 2019-01-15 DIAGNOSIS — Z79899 Other long term (current) drug therapy: Secondary | ICD-10-CM | POA: Diagnosis not present

## 2019-01-15 DIAGNOSIS — L700 Acne vulgaris: Secondary | ICD-10-CM | POA: Diagnosis not present

## 2019-01-15 DIAGNOSIS — K130 Diseases of lips: Secondary | ICD-10-CM | POA: Diagnosis not present

## 2019-02-03 ENCOUNTER — Other Ambulatory Visit: Payer: Self-pay | Admitting: Obstetrics and Gynecology

## 2019-02-03 DIAGNOSIS — B009 Herpesviral infection, unspecified: Secondary | ICD-10-CM

## 2019-02-18 DIAGNOSIS — K130 Diseases of lips: Secondary | ICD-10-CM | POA: Diagnosis not present

## 2019-02-18 DIAGNOSIS — Z79899 Other long term (current) drug therapy: Secondary | ICD-10-CM | POA: Diagnosis not present

## 2019-02-18 DIAGNOSIS — L700 Acne vulgaris: Secondary | ICD-10-CM | POA: Diagnosis not present

## 2019-02-19 DIAGNOSIS — L700 Acne vulgaris: Secondary | ICD-10-CM | POA: Diagnosis not present

## 2019-02-19 DIAGNOSIS — Z79899 Other long term (current) drug therapy: Secondary | ICD-10-CM | POA: Diagnosis not present

## 2019-03-13 ENCOUNTER — Other Ambulatory Visit: Payer: Self-pay | Admitting: Obstetrics and Gynecology

## 2019-03-13 DIAGNOSIS — B009 Herpesviral infection, unspecified: Secondary | ICD-10-CM

## 2019-03-26 DIAGNOSIS — L700 Acne vulgaris: Secondary | ICD-10-CM | POA: Diagnosis not present

## 2019-03-26 DIAGNOSIS — Z79899 Other long term (current) drug therapy: Secondary | ICD-10-CM | POA: Diagnosis not present

## 2019-04-17 ENCOUNTER — Other Ambulatory Visit: Payer: Self-pay | Admitting: Obstetrics and Gynecology

## 2019-04-17 DIAGNOSIS — B009 Herpesviral infection, unspecified: Secondary | ICD-10-CM

## 2019-04-30 DIAGNOSIS — D2239 Melanocytic nevi of other parts of face: Secondary | ICD-10-CM | POA: Diagnosis not present

## 2019-06-03 DIAGNOSIS — D2239 Melanocytic nevi of other parts of face: Secondary | ICD-10-CM | POA: Diagnosis not present

## 2019-06-03 DIAGNOSIS — L700 Acne vulgaris: Secondary | ICD-10-CM | POA: Diagnosis not present

## 2019-06-04 DIAGNOSIS — L700 Acne vulgaris: Secondary | ICD-10-CM | POA: Diagnosis not present

## 2019-06-04 DIAGNOSIS — Z79899 Other long term (current) drug therapy: Secondary | ICD-10-CM | POA: Diagnosis not present

## 2019-06-30 ENCOUNTER — Other Ambulatory Visit: Payer: Self-pay | Admitting: Obstetrics and Gynecology

## 2019-06-30 DIAGNOSIS — B009 Herpesviral infection, unspecified: Secondary | ICD-10-CM

## 2019-07-08 DIAGNOSIS — D485 Neoplasm of uncertain behavior of skin: Secondary | ICD-10-CM | POA: Diagnosis not present

## 2019-07-08 DIAGNOSIS — Z79899 Other long term (current) drug therapy: Secondary | ICD-10-CM | POA: Diagnosis not present

## 2019-07-08 DIAGNOSIS — L700 Acne vulgaris: Secondary | ICD-10-CM | POA: Diagnosis not present

## 2019-08-19 DIAGNOSIS — L281 Prurigo nodularis: Secondary | ICD-10-CM | POA: Diagnosis not present

## 2019-08-19 DIAGNOSIS — D224 Melanocytic nevi of scalp and neck: Secondary | ICD-10-CM | POA: Diagnosis not present

## 2019-08-19 DIAGNOSIS — L700 Acne vulgaris: Secondary | ICD-10-CM | POA: Diagnosis not present

## 2019-08-19 DIAGNOSIS — Z79899 Other long term (current) drug therapy: Secondary | ICD-10-CM | POA: Diagnosis not present

## 2019-09-01 ENCOUNTER — Telehealth: Payer: Self-pay

## 2019-09-01 ENCOUNTER — Other Ambulatory Visit: Payer: Self-pay | Admitting: Obstetrics and Gynecology

## 2019-09-01 MED ORDER — FLUCONAZOLE 150 MG PO TABS: 150.0000 mg | ORAL_TABLET | Freq: Once | ORAL | 0 refills | Status: AC

## 2019-09-01 NOTE — Progress Notes (Signed)
Rx diflucan for yeast vag sx after abx. F/u prn.

## 2019-09-01 NOTE — Telephone Encounter (Signed)
Pt aware.

## 2019-09-01 NOTE — Telephone Encounter (Signed)
I sent in a diflucan Rx for her. Please give it a few days to work. If it doesn't work, pt to sched an appt so we can eval further. Thx.

## 2019-09-01 NOTE — Telephone Encounter (Signed)
Patient was put on Abx by dentist. It has caused a yeast infection that she hasn't been able to get rid of w/OTC meds. Doesn't want to request from Dentist. Requesting rx for [place]. SJ#[phone]

## 2019-09-24 DIAGNOSIS — L700 Acne vulgaris: Secondary | ICD-10-CM | POA: Diagnosis not present

## 2019-09-24 DIAGNOSIS — Z79899 Other long term (current) drug therapy: Secondary | ICD-10-CM | POA: Diagnosis not present

## 2019-09-24 DIAGNOSIS — L308 Other specified dermatitis: Secondary | ICD-10-CM | POA: Diagnosis not present

## 2019-09-25 DIAGNOSIS — Z79899 Other long term (current) drug therapy: Secondary | ICD-10-CM | POA: Diagnosis not present

## 2019-09-25 DIAGNOSIS — L700 Acne vulgaris: Secondary | ICD-10-CM | POA: Diagnosis not present

## 2019-09-28 ENCOUNTER — Other Ambulatory Visit: Payer: Self-pay | Admitting: Obstetrics and Gynecology

## 2019-09-28 DIAGNOSIS — B009 Herpesviral infection, unspecified: Secondary | ICD-10-CM

## 2019-09-29 NOTE — Telephone Encounter (Signed)
Okay to refill? 

## 2019-11-12 DIAGNOSIS — L700 Acne vulgaris: Secondary | ICD-10-CM | POA: Diagnosis not present

## 2019-11-12 DIAGNOSIS — L603 Nail dystrophy: Secondary | ICD-10-CM | POA: Diagnosis not present

## 2019-12-12 ENCOUNTER — Other Ambulatory Visit: Payer: Self-pay | Admitting: Obstetrics and Gynecology

## 2019-12-12 DIAGNOSIS — B009 Herpesviral infection, unspecified: Secondary | ICD-10-CM

## 2019-12-16 DIAGNOSIS — D485 Neoplasm of uncertain behavior of skin: Secondary | ICD-10-CM | POA: Diagnosis not present

## 2019-12-16 DIAGNOSIS — D2239 Melanocytic nevi of other parts of face: Secondary | ICD-10-CM | POA: Diagnosis not present

## 2019-12-16 DIAGNOSIS — L720 Epidermal cyst: Secondary | ICD-10-CM | POA: Diagnosis not present

## 2019-12-16 DIAGNOSIS — L700 Acne vulgaris: Secondary | ICD-10-CM | POA: Diagnosis not present

## 2019-12-16 DIAGNOSIS — L905 Scar conditions and fibrosis of skin: Secondary | ICD-10-CM | POA: Diagnosis not present

## 2019-12-16 DIAGNOSIS — L603 Nail dystrophy: Secondary | ICD-10-CM | POA: Diagnosis not present

## 2020-01-08 ENCOUNTER — Telehealth: Payer: Self-pay

## 2020-01-08 NOTE — Telephone Encounter (Signed)
Patient called regarding the Puracyn you advised her to purchase. She states this is out of stock everywhere and wanted to know of a good alternative.

## 2020-01-08 NOTE — Telephone Encounter (Signed)
There's no spray on alternative. The only alternative would be the Cln brand sportwash, leave on 1 minute before washing off.   Thank you!

## 2020-01-11 NOTE — Telephone Encounter (Signed)
Patient advised of information per Dr. Neale Burly. She will continue to look for Puracyn on [place] and the [employer].

## 2020-01-21 ENCOUNTER — Encounter: Payer: Self-pay | Admitting: Dermatology

## 2020-01-21 ENCOUNTER — Other Ambulatory Visit: Payer: Self-pay

## 2020-01-21 ENCOUNTER — Telehealth (INDEPENDENT_AMBULATORY_CARE_PROVIDER_SITE_OTHER): Payer: Medicaid Other | Admitting: Dermatology

## 2020-01-21 DIAGNOSIS — L700 Acne vulgaris: Secondary | ICD-10-CM

## 2020-01-21 DIAGNOSIS — L309 Dermatitis, unspecified: Secondary | ICD-10-CM

## 2020-01-21 DIAGNOSIS — Z79899 Other long term (current) drug therapy: Secondary | ICD-10-CM | POA: Diagnosis not present

## 2020-01-21 MED ORDER — HYDROCORTISONE 2.5 % EX CREA: TOPICAL_CREAM | CUTANEOUS | 3 refills | Status: DC

## 2020-01-21 NOTE — Patient Instructions (Signed)
While taking Isotretinoin and for 30 days after you finish the medication, do not get pregnant, do not share pills, do not donate blood. Isotretinoin is best absorbed when taken with a fatty meal. Isotretinoin can make you sensitive to the sun. Daily careful sun protection including sunscreen SPF 30+ when outdoors is recommended.  Gentle Skin Care Guide  1. Bathe no more than once a day.  2. Avoid bathing in hot water  3. Use a mild soap like Dove, Vanicream, Cetaphil, CeraVe. Can use Lever 2000 or       Cetaphil antibacterial soap  4. Use soap only where you need it. On most days, use it under your arms, between       your legs, and on your fee. Let the water rinse other areas unless visibly dirty.  5. When you get out of the bath/shower, use a towel to gently blot your skin dry, don't           rub it.  6. While your skin is still a little damp, apply a moisturizing cream such as Vanicream,       CeraVe, Cetaphil, Eucerin, Sarna lotion or plain Vaseline Jelly. For hands apply        Neutrogena [place] Hand Cream or Excipial Hand Cream.  7. Reapply moisturizer any time you start to itch or feel dry.  8. Sometimes using free and clear laundry detergents can be helpful. Fabric softener       sheets should be avoided. Downy Free & Gentle liquid, or any liquid fabric softener       that is free of dyes and perfumes, it acceptable to use  9. If your doctor has given you prescription creams you may apply moisturizers over       them

## 2020-01-21 NOTE — Progress Notes (Signed)
   Follow-Up Visit   Subjective  Amy Frye is a 39 y.o. female who presents for the following: Acne (Isotretinoin therapy. ).   Virtual visit today Patient located in [place], [place] Physician located in [place], [place]   Patient voices understanding their insurance will be billed just like for a regular in office visit  Virtual visit duration 16 minutes   Isotretinoin F/U - 01/21/20 1600      Isotretinoin Follow Up   iPledge #  [id number]    Date  01/21/20    Two Forms of Birth Control  Implant;Female Condom    Acne breakouts since last visit?  Yes      Side Effects   Skin  Chapped Lips;Dry Nose;Dry Skin    Gastrointestinal  WNL    Neurological  Headache   Patient has allergies.   Constitutional  WNL        The following portions of the chart were reviewed this encounter and updated as appropriate:  Tobacco  Allergies  Meds  Problems  Med Hx  Surg Hx  Fam Hx      Review of Systems:  No other skin or systemic complaints except as noted in HPI or Assessment and Plan.  Objective  Well appearing patient in no apparent distress; mood and affect are within normal limits.  A focused examination was performed including face, chest, back, shoulders. Relevant physical exam findings are noted in the Assessment and Plan.   Assessment & Plan    Eczema  Start TMC 0.1% ointment to AA's shoulders, back, chest PRN for rash/itch. Avoid F/G/A. Start HC 2.5% cream to AA's cheeks PRN for rash/itch. Eczema, unspecified type  Ordered Medications: hydrocortisone 2.5 % cream    Acne Face with trace open comedones, rare small inflammatory papule. Back with rare small inflammatory papule. Patient will send in pregnancy test through MyChart. Pending negative pregnancy test, continue Absorica LD 24mg  2 PO QD #30 0RF.   While taking Isotretinoin and for 30 days after you finish the medication, do not get pregnant, do not share pills, do not donate blood. Isotretinoin is best  absorbed when taken with a fatty meal. Isotretinoin can make you sensitive to the sun. Daily careful sun protection including sunscreen SPF 30+ when outdoors is recommended.  Follow up 30 days.  , RMA, am acting as scribe for Anise Salvo, MD .   Documentation: I have reviewed the above documentation for accuracy and completeness, and I agree with the above.  Darden Dates, MD

## 2020-01-22 ENCOUNTER — Other Ambulatory Visit: Payer: Self-pay | Admitting: Dermatology

## 2020-01-22 ENCOUNTER — Other Ambulatory Visit: Payer: Self-pay

## 2020-01-22 ENCOUNTER — Encounter: Payer: Self-pay | Admitting: Dermatology

## 2020-01-22 MED ORDER — ABSORICA LD 24 MG PO CAPS: 2.0000 | ORAL_CAPSULE | Freq: Every day | ORAL | 0 refills | Status: DC

## 2020-01-22 NOTE — Telephone Encounter (Signed)
Patient advised through My Chart she is confirmed in ipledge and RX has been sent in to [facility].

## 2020-02-23 ENCOUNTER — Other Ambulatory Visit: Payer: Self-pay

## 2020-02-23 ENCOUNTER — Telehealth (INDEPENDENT_AMBULATORY_CARE_PROVIDER_SITE_OTHER): Payer: Medicaid Other | Admitting: Dermatology

## 2020-02-23 DIAGNOSIS — L700 Acne vulgaris: Secondary | ICD-10-CM

## 2020-02-23 DIAGNOSIS — K130 Diseases of lips: Secondary | ICD-10-CM | POA: Diagnosis not present

## 2020-02-23 DIAGNOSIS — L709 Acne, unspecified: Secondary | ICD-10-CM

## 2020-02-23 DIAGNOSIS — L853 Xerosis cutis: Secondary | ICD-10-CM

## 2020-02-23 NOTE — Progress Notes (Signed)
CC Acne and isotretinoin follow-up  HPI isotretinoin   Subjective  Amy Frye is a 39 y.o. female who presents for the following: Telehealth Consent and Acne.  Week # 56  Isotretinoin F/U - 02/23/20 1200      Isotretinoin Follow Up   Date  02/23/20    Two Forms of Birth Control  Female Condom;Implant    Acne breakouts since last visit?  No      Side Effects   Skin  Chapped Lips;Dry Nose;Dry Skin;Sunburn    Gastrointestinal  WNL    Neurological  WNL    Constitutional  Muscle/joint aches        Side effects: Dry skin, dry lips  Denies changes in night vision, shortness of breath, abdominal pain, nausea, vomiting, diarrhea, blood in stool or urine, visual changes, headaches, epistaxis, joint pain, myalgias, mood changes, depression, or suicidal ideation.   Patient is not pregnant, not seeking pregnancy, and not breastfeeding.  Objective Exam of face, chest and back performed and relevant findings noted in Assessment and Plan  A/P:  Acne vulgaris - severe, chronic, not currently at goal - Exam shows Face clear today - Continue Isotretinoin at 24 mg 2 po daily for one more month - Labs not needed, pt will send in photo of preg test, then will send in med - anticipate this is last month of tx   While taking Isotretinoin and for 30 days after you finish the medication, do not get pregnant, do not share pills, do not donate blood. Isotretinoin is best absorbed when taken with a fatty meal. Isotretinoin can make you sensitive to the sun. Daily careful sun protection including sunscreen SPF 30+ when outdoors is recommended.  Xerosis - Continue emollients as directed  Cheilitis - Continue lip balm as directed, Dr. Clayborne Artist Cortibalm recommended  Follow-up in 30 days  Virtual visit today Patient located in [place] Physician located in [place], [place] Patient voices understanding their insurance will be billed just like for a regular in office visit  Virtual visit  duration 8 minutes 29 sec. On 02/23/2020 at 12:00 PM   I, Leward Quan, CMA, am acting as scribe for Willeen Niece, MD .  Documentation: I have reviewed the above documentation for accuracy and completeness, and I agree with the above.  Willeen Niece MD

## 2020-02-24 ENCOUNTER — Encounter: Payer: Self-pay | Admitting: Dermatology

## 2020-02-24 MED ORDER — ABSORICA LD 24 MG PO CAPS: 2.0000 | ORAL_CAPSULE | Freq: Every day | ORAL | 0 refills | Status: DC

## 2020-03-09 ENCOUNTER — Other Ambulatory Visit: Payer: Self-pay | Admitting: Obstetrics and Gynecology

## 2020-03-09 DIAGNOSIS — B009 Herpesviral infection, unspecified: Secondary | ICD-10-CM

## 2020-03-29 ENCOUNTER — Other Ambulatory Visit: Payer: Self-pay

## 2020-03-29 ENCOUNTER — Ambulatory Visit: Payer: Medicaid Other | Admitting: Dermatology

## 2020-03-29 DIAGNOSIS — K130 Diseases of lips: Secondary | ICD-10-CM | POA: Diagnosis not present

## 2020-03-29 DIAGNOSIS — L853 Xerosis cutis: Secondary | ICD-10-CM

## 2020-03-29 DIAGNOSIS — L700 Acne vulgaris: Secondary | ICD-10-CM | POA: Diagnosis not present

## 2020-03-29 DIAGNOSIS — Z79899 Other long term (current) drug therapy: Secondary | ICD-10-CM

## 2020-03-29 NOTE — Progress Notes (Signed)
   Isotretinoin Follow-Up Visit   Subjective  Amy Frye is a 39 y.o. female who presents for the following: Follow-up.  Patient advises she took her last pill Friday. She has been picking at the spot on her chin that did not clear.  She also got a sunburn on her nose and L eyebrow area that is peeling.  Week # 60  Isotretinoin F/U - 03/29/20 1600      Isotretinoin Follow Up   iPledge # [id number]    Date 03/29/20    Two Forms of Birth Control Female Condom;Implant    Acne breakouts since last visit? Yes      Side Effects   Skin Chapped Lips;Dry Lips;Nosebleed    Gastrointestinal WNL    Neurological WNL    Constitutional Muscle/joint aches    Other Side Effects dehydrated          Side effects: Dry skin, dry lips  Denies changes in night vision, shortness of breath, abdominal pain, nausea, vomiting, diarrhea, blood in stool or urine, visual changes, headaches, epistaxis, joint pain, myalgias, mood changes, depression, or suicidal ideation.   Patient is not pregnant, not seeking pregnancy, and not breastfeeding.   The following portions of the chart were reviewed this encounter and updated as appropriate: medications, allergies, medical history  Review of Systems:  No other skin or systemic complaints except as noted in HPI or Assessment and Plan.  Objective  Well appearing patient in no apparent distress; mood and affect are within normal limits.  An examination of the face, neck, chest, and back was performed and relevant findings are noted below.   Objective  Head - Anterior (Face): Firm subq nodule on left chin with some overlying excoriation   Assessment & Plan   Acne vulgaris Head - Anterior (Face)  Patient completed 60 week isotretinoin course with good results.  Residual cyst on left chin did not respond to isotretinoin.  Will likely need excision to remove.  Discussed resulting scar.  Recommend over the counter benzoyl peroxide to spot treat cyst if it  becomes inflamed.  Avoid picking  Do not recommend having cyst excised for at least 3 months due to recent accutane therapy.  Other Related Procedures Pregnancy, urine  While taking Isotretinoin and for 30 days after you finish the medication, do not get pregnant, do not share pills, do not donate blood. Isotretinoin is best absorbed when taken with a fatty meal. Isotretinoin can make you sensitive to the sun. Daily careful sun protection including sunscreen SPF 30+ when outdoors is recommended.  Xerosis - Continue emollients as directed  Cheilitis - Continue lip balm as directed, Dr. Clayborne Artist Cortibalm recommended  Follow-up in 2 months with Dr. Neale Burly  Anise Salvo, RMA, am acting as scribe for Willeen Niece, MD .  Documentation: I have reviewed the above documentation for accuracy and completeness, and I agree with the above.  Willeen Niece MD

## 2020-03-29 NOTE — Patient Instructions (Addendum)
Recommend daily broad spectrum sunscreen SPF 30+ to sun-exposed areas, reapply every 2 hours as needed. Call for new or changing lesions.  Have pregnancy test done July 12th.   While taking Isotretinoin and for 30 days after you finish the medication, do not get pregnant, do not share pills, do not donate blood. Isotretinoin is best absorbed when taken with a fatty meal. Isotretinoin can make you sensitive to the sun. Daily careful sun protection including sunscreen SPF 30+ when outdoors is recommended.

## 2020-04-11 ENCOUNTER — Telehealth: Payer: Self-pay

## 2020-04-11 NOTE — Telephone Encounter (Signed)
Pt left msg on triage line asking if [facility] would be willing to call in a RF of her BP Rx. Her PCP wont fill it until she has her appt which is next week. Pt is completely out. Was advised by her PCP to reach [place] since we have seen pt recently compared to them. Last visit to our office was 11/2018. Pt aware [facility] is out of office, checking msgs daily. Also advised if she feels her BP is not right, to head over to an urgent care/ER.

## 2020-04-12 NOTE — Telephone Encounter (Signed)
We have not seen pt since 2/20 and she last had nexplanon placed. What OCP is she on? Whoever removed nexplanon and started her on pills should refill it for her.

## 2020-04-13 NOTE — Telephone Encounter (Signed)
Sorry, no, we have never managed her BP and haven't seen her in 1 1/2 yrs. I don't know what to tell her.

## 2020-04-13 NOTE — Telephone Encounter (Signed)
Pt aware. Pt also asked how long nexplanon good for, advised 3 yrs.

## 2020-04-13 NOTE — Telephone Encounter (Signed)
Blood pressure RF is what the pt is asking for. I apologize, I should've written it out.

## 2020-04-19 ENCOUNTER — Ambulatory Visit: Payer: Medicaid Other | Admitting: Internal Medicine

## 2020-04-20 ENCOUNTER — Encounter: Payer: Self-pay | Admitting: Internal Medicine

## 2020-04-20 ENCOUNTER — Other Ambulatory Visit: Payer: Self-pay

## 2020-04-20 ENCOUNTER — Ambulatory Visit (INDEPENDENT_AMBULATORY_CARE_PROVIDER_SITE_OTHER): Payer: Medicaid Other | Admitting: Internal Medicine

## 2020-04-20 VITALS — BP 134/89 | HR 90 | Ht 68.0 in | Wt 186.8 lb

## 2020-04-20 DIAGNOSIS — F900 Attention-deficit hyperactivity disorder, predominantly inattentive type: Secondary | ICD-10-CM | POA: Diagnosis not present

## 2020-04-20 DIAGNOSIS — R609 Edema, unspecified: Secondary | ICD-10-CM | POA: Insufficient documentation

## 2020-04-20 DIAGNOSIS — E54 Ascorbic acid deficiency: Secondary | ICD-10-CM | POA: Insufficient documentation

## 2020-04-20 DIAGNOSIS — F17200 Nicotine dependence, unspecified, uncomplicated: Secondary | ICD-10-CM | POA: Diagnosis not present

## 2020-04-20 MED ORDER — TRIAMTERENE-HCTZ 37.5-25 MG PO TABS: 1.0000 | ORAL_TABLET | Freq: Every day | ORAL | 3 refills | Status: DC

## 2020-04-20 NOTE — Assessment & Plan Note (Signed)
Asymptomatic at the present time

## 2020-04-20 NOTE — Progress Notes (Signed)
Established Patient Office Visit  SUBJECTIVE:  Subjective  Patient ID: Amy Frye, female    DOB: December 13, 1980  Age: 39 y.o. MRN: [id number]  CC:  Chief Complaint  Patient presents with   Edema    patient was out of hctz x 5 days and was having a lot of swelling in feet     HPI Amy Frye is a 39 y.o. female presenting today for edema after missing 5 days of HCTZ.   She reports that she ran out of her HCTZ medication and she missed 5 days worth of doses. She does need a refill on this medication. She states that she had to put her feet up for two days to get the edema to go down.   She states that she has been getting phentermine 37.5mg  and topamax 25mg  from [facility]. She states that she lost about 15 lbs on these, but when she was taken off, she gained some of the weight back.   Past Medical History:  Diagnosis Date   Hypertension     Past Surgical History:  Procedure Laterality Date   BREAST SURGERY  2017   Lift/ Enhancement bilateral   CESAREAN SECTION  04/10/2003    Family History  Adopted: Yes    Social History   Socioeconomic History   Marital status: Single    Spouse name: Not on file   Number of children: Not on file   Years of education: Not on file   Highest education level: Not on file  Occupational History   Not on file  Tobacco Use   Smoking status: Current Every Day Smoker    Packs/day: 0.50    Types: Cigarettes   Smokeless tobacco: Never Used  Vaping Use   Vaping Use: Never used  Substance and Sexual Activity   Alcohol use: Yes    Alcohol/week: 10.0 standard drinks    Types: 10 Standard drinks or equivalent per week    Comment: occ   Drug use: Not Currently   Sexual activity: Not Currently    Partners: Male    Birth control/protection: I.U.D.    Comment: Mirena  Other Topics Concern   Not on file  Social History Narrative   Not on file   Social Determinants of Health   Financial Resource Strain:     Difficulty of Paying Living Expenses:   Food Insecurity:    Worried About 04/12/2003 in the Last Year:    Programme researcher, broadcasting/film/video in the Last Year:   Transportation Needs:    Barista (Medical):    Lack of Transportation (Non-Medical):   Physical Activity:    Days of Exercise per Week:    Minutes of Exercise per Session:   Stress:    Feeling of Stress :   Social Connections:    Frequency of Communication with Friends and Family:    Frequency of Social Gatherings with Friends and Family:    Attends Religious Services:    Active Member of Clubs or Organizations:    Attends Freight forwarder:    Marital Status:   Intimate Partner Violence:    Fear of Current or Ex-Partner:    Emotionally Abused:    Physically Abused:    Sexually Abused:      Current Outpatient Medications:    hydrochlorothiazide (MICROZIDE) 12.5 MG capsule, Take 12.5 mg by mouth daily., Disp: , Rfl:    loratadine (CLARITIN) 10 MG tablet, Take 10 mg  by mouth daily., Disp: , Rfl:    ABSORICA LD 24 MG CAPS, Take 2 capsules by mouth daily. (Patient not taking: Reported on 04/20/2020), Disp: , Rfl:    ABSORICA LD 24 MG CAPS, Take 2 capsules by mouth daily. (Patient not taking: Reported on 04/20/2020), Disp: 60 capsule, Rfl: 0   ACZONE 7.5 % GEL, APPLY A THIN LAYER TO FACE IN THE MORNING, WASH OFF AT BEDTIME (Patient not taking: Reported on 04/20/2020), Disp: , Rfl:    ADDERALL XR 20 MG 24 hr capsule, Take 20 mg by mouth daily. (Patient not taking: Reported on 04/20/2020), Disp: , Rfl:    amphetamine-dextroamphetamine (ADDERALL) 15 MG tablet, TAKE 1 TABLET BY MOUTH IN AM (Patient not taking: Reported on 04/20/2020), Disp: , Rfl:    ciclopirox (LOPROX) 0.77 % cream, Apply  a small amount to affected area every night  to fingernail and cover with vaseline and bandaid (Patient not taking: Reported on 04/20/2020), Disp: , Rfl:    hydrocortisone 2.5 % cream, Apply to affected areas at  face 1-2 times daily as needed for rash up to 1 week. (Patient not taking: Reported on 04/20/2020), Disp: 90 g, Rfl: 3   phentermine (ADIPEX-P) 37.5 MG tablet, Take 37.5 mg by mouth daily before breakfast. (Patient not taking: Reported on 04/20/2020), Disp: , Rfl:    triamcinolone (KENALOG) 0.025 % ointment, Apply  a small amount to affected area twice a day  Avoid face, groin, underarms (Patient not taking: Reported on 04/20/2020), Disp: , Rfl:    triamterene-hydrochlorothiazide (MAXZIDE-25) 37.5-25 MG tablet, Take 1 tablet by mouth daily., Disp: 90 tablet, Rfl: 3  Current Facility-Administered Medications:    etonogestrel (NEXPLANON) implant 68 mg, 68 mg, Subdermal, Once, Copland, Alicia B, PA-C   Allergies  Allergen Reactions   Penicillins Other (See Comments)    unknown   Sulfa Antibiotics Swelling    ROS Review of Systems  Constitutional: Negative.   HENT: Negative.   Eyes: Negative.   Respiratory: Negative.   Cardiovascular: Positive for leg swelling (edema 5 days).  Gastrointestinal: Negative.   Endocrine: Negative.   Genitourinary: Negative.   Musculoskeletal: Negative.   Skin: Negative.   Allergic/Immunologic: Negative.   Neurological: Negative.   Hematological: Negative.   Psychiatric/Behavioral: Negative.   All other systems reviewed and are negative.    OBJECTIVE:    Physical Exam Vitals reviewed.  Constitutional:      Appearance: Normal appearance.  HENT:     Mouth/Throat:     Mouth: Mucous membranes are moist.  Eyes:     Pupils: Pupils are equal, round, and reactive to light.  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     Pulses: Normal pulses.     Heart sounds: Murmur heard.  Systolic (midsystolic click) murmur is present.   Pulmonary:     Effort: Pulmonary effort is normal.     Breath sounds: Normal breath sounds.  Abdominal:     Palpations: There is no hepatomegaly,  splenomegaly or mass.     Tenderness: There is no abdominal tenderness.  Musculoskeletal:     Right lower leg: 1+ Edema present.     Left lower leg: 1+ Edema present.  Neurological:     Mental Status: She is alert and oriented to person, place, and time.  Psychiatric:        Mood and Affect: Mood and affect normal.  Behavior: Behavior normal.     BP 134/89    Pulse 90    Ht 5\' 8"  (1.727 m)    Wt 186 lb 12.8 oz (84.7 kg)    BMI 28.40 kg/m  Wt Readings from Last 3 Encounters:  04/20/20 186 lb 12.8 oz (84.7 kg)  11/26/18 170 lb (77.1 kg)  10/27/18 170 lb (77.1 kg)    Health Maintenance Due  Topic Date Due   Hepatitis C Screening  Never done   COVID-19 Vaccine (1) Never done   TETANUS/TDAP  Never done    There are no preventive care reminders to display for this patient.  CBC Latest Ref Rng & Units 03/21/2015 10/05/2012  WBC 3.6 - 11.0 K/uL 11.8(H) 11.3(H)  Hemoglobin 12.0 - 16.0 g/dL 10/07/2012 43.1)  Hematocrit 35 - 47 % 37.8 25.8(L)  Platelets 150 - 440 K/uL 139(L) 306   CMP Latest Ref Rng & Units 03/21/2015 10/05/2012  Glucose 65 - 99 mg/dL 82 10/07/2012)  BUN 6 - 20 mg/dL 086(P) 11  Creatinine 61(P - 1.00 mg/dL 5.09) 3.26(Z  Sodium 1.24 - 145 mmol/L 130(L) 141  Potassium 3.5 - 5.1 mmol/L 2.3(LL) 2.7(L)  Chloride 101 - 111 mmol/L 93(L) 105  CO2 22 - 32 mmol/L 26 29  Calcium 8.9 - 10.3 mg/dL 8.3(L) 8.3(L)  Total Protein 6.5 - 8.1 g/dL 6.7 -  Total Bilirubin 0.3 - 1.2 mg/dL 1.1 -  Alkaline Phos 38 - 126 U/L 104 -  AST 15 - 41 U/L 91(H) -  ALT 14 - 54 U/L 47 -    Lab Results  Component Value Date   TSH 5.05 (H) 10/05/2012   Lab Results  Component Value Date   ALBUMIN 2.8 (L) 03/21/2015   ANIONGAP 11 03/21/2015   No results found for: CHOL, HDL, LDLCALC, CHOLHDL No results found for: TRIG No results found for: HGBA1C    ASSESSMENT & PLAN:   Problem List Items Addressed This Visit      Other   Barlow disease or syndrome    Asymptomatic at the present time       Swelling - Primary     has 1-1-1/2) edema.  Start on phentermine as needed.      Relevant Medications   triamterene-hydrochlorothiazide (MAXZIDE-25) 37.5-25 MG tablet   Smoker unmotivated to quit    Patient was advised to make every effort to try to quit smoking.      Attention deficit hyperactivity disorder (ADHD), predominantly inattentive type    Advised not to take phentermine and Topamax.         Meds ordered this encounter  Medications   triamterene-hydrochlorothiazide (MAXZIDE-25) 37.5-25 MG tablet    Sig: Take 1 tablet by mouth daily.    Dispense:  90 tablet    Refill:  3    Swelling - Plan: triamterene-hydrochlorothiazide (MAXZIDE-25) 37.5-25 MG tablet  Barlow disease or syndrome  Attention deficit hyperactivity disorder (ADHD), predominantly inattentive type  Smoker unmotivated to quit Follow-up: Return in about 3 months (around 07/21/2020).    Dr. 09/20/2020 [facility] [address] [place], [place]   By signing my name below, I, [id number], attest that this documentation has been prepared under the direction and in the presence of [employer], MD. Electronically Signed: Corky Downs, MD 04/20/20, 11:31 AM   I personally performed the services described in this documentation, which was SCRIBED in my presence. The recorded information has been reviewed and considered accurate. It has been  edited as necessary during review. Corky DownsJaved Saraiyah Hemminger, MD

## 2020-04-20 NOTE — Patient Instructions (Signed)
Calorie Counting for Weight Loss Calories are units of energy. Your body needs a certain amount of calories from food to keep you going throughout the day. When you eat more calories than your body needs, your body stores the extra calories as fat. When you eat fewer calories than your body needs, your body burns fat to get the energy it needs. Calorie counting means keeping track of how many calories you eat and drink each day. Calorie counting can be helpful if you need to lose weight. If you make sure to eat fewer calories than your body needs, you should lose weight. Ask your health care provider what a healthy weight is for you. For calorie counting to work, you will need to eat the right number of calories in a day in order to lose a healthy amount of weight per week. A dietitian can help you determine how many calories you need in a day and will give you suggestions on how to reach your calorie goal.  A healthy amount of weight to lose per week is usually 1-2 lb (0.5-0.9 kg). This usually means that your daily calorie intake should be reduced by 500-750 calories.  Eating 1,200 - 1,500 calories per day can help most women lose weight.  Eating 1,500 - 1,800 calories per day can help most men lose weight. What is my plan? My goal is to have __________ calories per day. If I have this many calories per day, I should lose around __________ pounds per week. What do I need to know about calorie counting? In order to meet your daily calorie goal, you will need to:  Find out how many calories are in each food you would like to eat. Try to do this before you eat.  Decide how much of the food you plan to eat.  Write down what you ate and how many calories it had. Doing this is called keeping a food log. To successfully lose weight, it is important to balance calorie counting with a healthy lifestyle that includes regular activity. Aim for 150 minutes of moderate exercise (such as walking) or 75  minutes of vigorous exercise (such as running) each week. Where do I find calorie information?  The number of calories in a food can be found on a Nutrition Facts label. If a food does not have a Nutrition Facts label, try to look up the calories online or ask your dietitian for help. Remember that calories are listed per serving. If you choose to have more than one serving of a food, you will have to multiply the calories per serving by the amount of servings you plan to eat. For example, the label on a package of bread might say that a serving size is 1 slice and that there are 90 calories in a serving. If you eat 1 slice, you will have eaten 90 calories. If you eat 2 slices, you will have eaten 180 calories. How do I keep a food log? Immediately after each meal, record the following information in your food log:  What you ate. Don't forget to include toppings, sauces, and other extras on the food.  How much you ate. This can be measured in cups, ounces, or number of items.  How many calories each food and drink had.  The total number of calories in the meal. Keep your food log near you, such as in a small notebook in your pocket, or use a mobile app or website. Some programs will calculate   calories for you and show you how many calories you have left for the day to meet your goal. What are some calorie counting tips?  1. Use your calories on foods and drinks that will fill you up and not leave you hungry: ? Some examples of foods that fill you up are nuts and nut butters, vegetables, lean proteins, and high-fiber foods like whole grains. High-fiber foods are foods with more than 5 g fiber per serving. ? Drinks such as sodas, specialty coffee drinks, alcohol, and juices have a lot of calories, yet do not fill you up. 2. Eat nutritious foods and avoid empty calories. Empty calories are calories you get from foods or beverages that do not have many vitamins or protein, such as candy, sweets, and  soda. It is better to have a nutritious high-calorie food (such as an avocado) than a food with few nutrients (such as a bag of chips). 3. Know how many calories are in the foods you eat most often. This will help you calculate calorie counts faster. 4. Pay attention to calories in drinks. Low-calorie drinks include water and unsweetened drinks. 5. Pay attention to nutrition labels for "low fat" or "fat free" foods. These foods sometimes have the same amount of calories or more calories than the full fat versions. They also often have added sugar, starch, or salt, to make up for flavor that was removed with the fat. 6. Find a way of tracking calories that works for you. Get creative. Try different apps or programs if writing down calories does not work for you. What are some portion control tips?  Know how many calories are in a serving. This will help you know how many servings of a certain food you can have.  Use a measuring cup to measure serving sizes. You could also try weighing out portions on a kitchen scale. With time, you will be able to estimate serving sizes for some foods.  Take some time to put servings of different foods on your favorite plates, bowls, and cups so you know what a serving looks like.  Try not to eat straight from a bag or box. Doing this can lead to overeating. Put the amount you would like to eat in a cup or on a plate to make sure you are eating the right portion.  Use smaller plates, glasses, and bowls to prevent overeating.  Try not to multitask (for example, watch TV or use your computer) while eating. If it is time to eat, sit down at a table and enjoy your food. This will help you to know when you are full. It will also help you to be aware of what you are eating and how much you are eating. What are tips for following this plan? Reading food labels  Check the calorie count compared to the serving size. The serving size may be smaller than what you are used  to eating.  Check the source of the calories. Make sure the food you are eating is high in vitamins and protein and low in saturated and trans fats. Shopping  Read nutrition labels while you shop. This will help you make healthy decisions before you decide to purchase your food.  Make a grocery list and stick to it. Cooking  Try to cook your favorite foods in a healthier way. For example, try baking instead of frying.  Use low-fat dairy products. Meal planning  Use more fruits and vegetables. Half of your plate should be fruits   and vegetables.  Include lean proteins like poultry and fish. How do I count calories when eating out? 1. Ask for smaller portion sizes. 2. Consider sharing an entree and sides instead of getting your own entree. 3. If you get your own entree, eat only half. Ask for a box at the beginning of your meal and put the rest of your entree in it so you are not tempted to eat it. 4. If calories are listed on the menu, choose the lower calorie options. 5. Choose dishes that include vegetables, fruits, whole grains, low-fat dairy products, and lean protein. 6. Choose items that are boiled, broiled, grilled, or steamed. Stay away from items that are buttered, battered, fried, or served with cream sauce. Items labeled "crispy" are usually fried, unless stated otherwise. 7. Choose water, low-fat milk, unsweetened iced tea, or other drinks without added sugar. If you want an alcoholic beverage, choose a lower calorie option such as a glass of wine or light beer. 8. Ask for dressings, sauces, and syrups on the side. These are usually high in calories, so you should limit the amount you eat. 9. If you want a salad, choose a garden salad and ask for grilled meats. Avoid extra toppings like bacon, cheese, or fried items. Ask for the dressing on the side, or ask for olive oil and vinegar or lemon to use as dressing. 10. Estimate how many servings of a food you are given. For example,  a serving of cooked rice is  cup or about the size of half a baseball. Knowing serving sizes will help you be aware of how much food you are eating at restaurants. The list below tells you how big or small some common portion sizes are based on everyday objects: ? 1 oz--4 stacked dice. ? 3 oz--1 deck of cards. ? 1 tsp--1 die. ? 1 Tbsp-- a ping-pong ball. ? 2 Tbsp--1 ping-pong ball. ?  cup-- baseball. ? 1 cup--1 baseball. Summary  Calorie counting means keeping track of how many calories you eat and drink each day. If you eat fewer calories than your body needs, you should lose weight.  A healthy amount of weight to lose per week is usually 1-2 lb (0.5-0.9 kg). This usually means reducing your daily calorie intake by 500-750 calories.  The number of calories in a food can be found on a Nutrition Facts label. If a food does not have a Nutrition Facts label, try to look up the calories online or ask your dietitian for help.  Use your calories on foods and drinks that will fill you up, and not on foods and drinks that will leave you hungry.  Use smaller plates, glasses, and bowls to prevent overeating. This information is not intended to replace advice given to you by your health care provider. Make sure you discuss any questions you have with your health care provider.  Document Revised: 06/20/2018 Document Reviewed: 08/31/2016 Elsevier Patient Education  2020 [employer]  ---------------------------------------------------------------------------------------------------------------------  Exercising to Lose Weight Exercise is structured, repetitive physical activity to improve fitness and health. Getting regular exercise is important for everyone. It is especially important if you are overweight. Being overweight increases your risk of heart disease, stroke, diabetes, high blood pressure, and several types of cancer. Reducing your calorie intake and exercising can help you lose  weight. Exercise is usually categorized as moderate or vigorous intensity. To lose weight, most people need to do a certain amount of moderate-intensity or vigorous-intensity exercise each week. Moderate-intensity   exercise  Moderate-intensity exercise is any activity that gets you moving enough to burn at least three times more energy (calories) than if you were sitting. Examples of moderate exercise include:  Walking a mile in 15 minutes.  Doing light yard work.  Biking at an easy pace. Most people should get at least 150 minutes (2 hours and 30 minutes) a week of moderate-intensity exercise to maintain their body weight. Vigorous-intensity exercise Vigorous-intensity exercise is any activity that gets you moving enough to burn at least six times more calories than if you were sitting. When you exercise at this intensity, you should be working hard enough that you are not able to carry on a conversation. Examples of vigorous exercise include:  Running.  Playing a team sport, such as football, basketball, and soccer.  Jumping rope. Most people should get at least 75 minutes (1 hour and 15 minutes) a week of vigorous-intensity exercise to maintain their body weight. How can exercise affect me? When you exercise enough to burn more calories than you eat, you lose weight. Exercise also reduces body fat and builds muscle. The more muscle you have, the more calories you burn. Exercise also:  Improves mood.  Reduces stress and tension.  Improves your overall fitness, flexibility, and endurance.  Increases bone strength. The amount of exercise you need to lose weight depends on:  Your age.  The type of exercise.  Any health conditions you have.  Your overall physical ability. Talk to your health care provider about how much exercise you need and what types of activities are safe for you. What actions can I take to lose weight? Nutrition  7. Make changes to your diet as told by  your health care provider or diet and nutrition specialist (dietitian). This may include: ? Eating fewer calories. ? Eating more protein. ? Eating less unhealthy fats. ? Eating a diet that includes fresh fruits and vegetables, whole grains, low-fat dairy products, and lean protein. ? Avoiding foods with added fat, salt, and sugar. 8. Drink plenty of water while you exercise to prevent dehydration or heat stroke. Activity 11. Choose an activity that you enjoy and set realistic goals. Your health care provider can help you make an exercise plan that works for you. 12. Exercise at a moderate or vigorous intensity most days of the week. ? The intensity of exercise may vary from person to person. You can tell how intense a workout is for you by paying attention to your breathing and heartbeat. Most people will notice their breathing and heartbeat get faster with more intense exercise. 13. Do resistance training twice each week, such as: ? Push-ups. ? Sit-ups. ? Lifting weights. ? Using resistance bands. 14. Getting short amounts of exercise can be just as helpful as long structured periods of exercise. If you have trouble finding time to exercise, try to include exercise in your daily routine. ? Get up, stretch, and walk around every 30 minutes throughout the day. ? Go for a walk during your lunch break. ? Park your car farther away from your destination. ? If you take public transportation, get off one stop early and walk the rest of the way. ? Make phone calls while standing up and walking around. ? Take the stairs instead of elevators or escalators. 15. Wear comfortable clothes and shoes with good support. 16. Do not exercise so much that you hurt yourself, feel dizzy, or get very short of breath. Where to find more information  [place] Department of   Health and Human Services: www.hhs.gov  Centers for Disease Control and Prevention (CDC): www.cdc.gov Contact a health care provider:  Before  starting a new exercise program.  If you have questions or concerns about your weight.  If you have a medical problem that keeps you from exercising. Get help right away if you have any of the following while exercising:  Injury.  Dizziness.  Difficulty breathing or shortness of breath that does not go away when you stop exercising.  Chest pain.  Rapid heartbeat. Summary  Being overweight increases your risk of heart disease, stroke, diabetes, high blood pressure, and several types of cancer.  Losing weight happens when you burn more calories than you eat.  Reducing the amount of calories you eat in addition to getting regular moderate or vigorous exercise each week helps you lose weight. This information is not intended to replace advice given to you by your health care provider. Make sure you discuss any questions you have with your health care provider.  Document Revised: 10/14/2017 Document Reviewed: 10/14/2017 Elsevier Patient Education  2020 [employer] Inc.   

## 2020-04-20 NOTE — Assessment & Plan Note (Signed)
has 1-1-1/2) edema.  Start on phentermine as needed.

## 2020-04-20 NOTE — Assessment & Plan Note (Signed)
Advised not to take phentermine and Topamax.

## 2020-04-20 NOTE — Assessment & Plan Note (Signed)
Patient was advised to make every effort to try to quit smoking.

## 2020-04-25 ENCOUNTER — Encounter: Payer: Self-pay | Admitting: Dermatology

## 2020-04-25 ENCOUNTER — Telehealth: Payer: Self-pay

## 2020-04-25 NOTE — Telephone Encounter (Signed)
Copied from CRM [id number]. Topic: General - Other >> Apr 25, 2020 11:56 AM Angela Nevin wrote: Patient calling to inquire if Joycelyn Man would be willing to accept her as a new patient, she states that she was referred to her by Franciso Bend.

## 2020-04-25 NOTE — Telephone Encounter (Signed)
I am unfortunately not taking new patients at this time, but she could establish with Marvell Fuller, NP.

## 2020-04-26 NOTE — Telephone Encounter (Signed)
Pt advised.  Apt with Marcelino Duster 06/03/2020 at 2pm  Thanks,   -Vernona Rieger

## 2020-04-29 ENCOUNTER — Encounter: Payer: Medicaid Other | Admitting: Internal Medicine

## 2020-05-21 DIAGNOSIS — S2249XA Multiple fractures of ribs, unspecified side, initial encounter for closed fracture: Secondary | ICD-10-CM | POA: Insufficient documentation

## 2020-05-24 ENCOUNTER — Ambulatory Visit: Payer: Self-pay

## 2020-05-24 NOTE — Telephone Encounter (Signed)
This should not come to [place] until she is established with the practice.  Please forward to her current PCP

## 2020-05-24 NOTE — Telephone Encounter (Signed)
Fell through ceiling on Saturday, a joyce stopped her fall. Knocked wind out of her - pt did not fall to the floor. Pt c/o dull constant pain  under left  breast and left side of chest. C/o sharp pain  to deep breath and when coughing and sneezing. Constant pain of sitting still: 4/10 and with activity or sneezing: 8/10. Took Tylenol without effect. Applied  arnica oil (neighbor's suggestion)to affected area  without effect.  Has breast implants but are intact. Denies SOB, radiating pain.  Care advice given. Advised to cut back on smoking cigarettes, try Ibuprofen, ice to area.  Pt advised that she could go to ED/[facility]. Pt wants to do to [facility]. Her son will drive her there.  Advised pt to call 911 for any worsening sx (SOB, worsening pain). Advised pt that she may be sent to ED if necessary based on findings. Pt verbalized understanding of care advice. Advised pt to call [facility] to see if can get in sooner appt.  Reason for Disposition . Taking a deep breath makes pain worse    Pt sustained fall through ceiling and caught by a joyce  Answer Assessment - Initial Assessment Questions 1. LOCATION: "Where does it hurt?"       Under left breast and left side of chest 2. RADIATION: "Does the pain go anywhere else?" (e.g., into neck, jaw, arms, back)     no 3. ONSET: "When did the chest pain begin?" (Minutes, hours or days)      Saturday 4. PATTERN "Does the pain come and go, or has it been constant since it started?"  "Does it get worse with exertion?"      Constant wirsens with activity or sneezing, cough 5. DURATION: "How long does it last" (e.g., seconds, minutes, hours)    Few seconds 6. SEVERITY: "How bad is the pain?"  (e.g., Scale 1-10; mild, moderate, or severe)    - MILD (1-3): doesn't interfere with normal activities     - MODERATE (4-7): interferes with normal activities or awakens from sleep    - SEVERE (8-10): excruciating pain, unable to do any normal activities       Dull with no  activity- 8/10 with deep breath or sneezing, coughing CARDIAC RISK FACTORS: "Do you have any history of heart problems or risk factors for heart disease?" (e.g., angina, prior heart attack; diabetes, high blood pressure, high cholesterol, smoker, or strong family history of heart disease)     ? Barlow's 8. PULMONARY RISK FACTORS: "Do you have any history of lung disease?"  (e.g., blood clots in lung, asthma, emphysema, birth control pills)     No-  9. CAUSE: "What do you think is causing the chest pain?"     Fall through ceiling 10. OTHER SYMPTOMS: "Do you have any other symptoms?" (e.g., dizziness, nausea, vomiting, sweating, fever, difficulty breathing, cough)       no 11. PREGNANCY: "Is there any chance you are pregnant?" "When was your last menstrual period?"       No- has implant  Protocols used: CHEST PAIN-A-AH

## 2020-05-24 NOTE — Telephone Encounter (Signed)
Note was sent to Dr Juel Burrow previously. Thank you!

## 2020-06-03 ENCOUNTER — Ambulatory Visit: Payer: Self-pay | Admitting: Adult Health

## 2020-06-07 ENCOUNTER — Telehealth: Payer: Self-pay

## 2020-06-07 NOTE — Telephone Encounter (Signed)
Patient has a scheduled appt with Marcelino Duster on 06/21/2020, I contacted patient to reschedule appointment for that day since Marcelino Duster will not be in office. Patient was upset stating that our office has rescheduled her twice before and each time she was told Marcelino Duster would not be in office. She inquired if any other doctors in our office was accepting new patients, at this time I let her know only Marcelino Duster was. Patient is requesting that she be rescheduled with another doctor stating that it is not her fault appointments keep getting rescheduled. She is requesting another doctor in office see her that day. Please advise. KW

## 2020-06-08 ENCOUNTER — Ambulatory Visit: Payer: Self-pay | Admitting: Adult Health

## 2020-06-09 ENCOUNTER — Ambulatory Visit: Payer: Medicaid Other | Admitting: Dermatology

## 2020-06-16 ENCOUNTER — Ambulatory Visit: Payer: Medicaid Other | Admitting: Physician Assistant

## 2020-06-21 ENCOUNTER — Ambulatory Visit: Payer: Self-pay | Admitting: Adult Health

## 2020-06-30 ENCOUNTER — Telehealth: Payer: Self-pay

## 2020-06-30 NOTE — Telephone Encounter (Signed)
Please advise for Michelle? 

## 2020-06-30 NOTE — Telephone Encounter (Signed)
Copied from CRM [id number]. Topic: General - Inquiry >> Jun 30, 2020 11:02 AM Adrian Prince D wrote: Reason for CRM: Patient called and wanted to be seen prior to her new patient appointment. I explained to the patient that she could not have an appointment prior to her new patient appointment but she said that she had been rescheduled since August. She said she doesn't want to be seen but she wants a prescription called in. So she asked me to send a message back and have someone call her at [phone]. Please advise the patient

## 2020-07-04 NOTE — Telephone Encounter (Signed)
Unable to call in prescription until patient is established in office as a patient. Advise urgent care or previous PCP for refills if needed prior to ne patient appointment.

## 2020-07-04 NOTE — Telephone Encounter (Signed)
Patient has been advised. KW 

## 2020-07-08 ENCOUNTER — Ambulatory Visit
Admission: EM | Admit: 2020-07-08 | Discharge: 2020-07-08 | Disposition: A | Discharge status: Home / Self Care | Payer: Medicaid Other | Attending: Emergency Medicine | Admitting: Emergency Medicine

## 2020-07-08 DIAGNOSIS — J0100 Acute maxillary sinusitis, unspecified: Secondary | ICD-10-CM

## 2020-07-08 MED ORDER — AZITHROMYCIN 250 MG PO TABS: 250.0000 mg | ORAL_TABLET | Freq: Every day | ORAL | 0 refills | Status: DC

## 2020-07-08 NOTE — ED Provider Notes (Signed)
Amy Frye    CSN: [id number] Arrival date & time: 07/08/20  1649      History   Chief Complaint Chief Complaint  Patient presents with   Otalgia    HPI Amy Frye is a 39 y.o. female.   Patient presents with sinus congestion, postnasal drip, and sinus pressure x2 weeks.  She states her symptoms have gotten worse in the last 2 days and now her right ear is painful.  She denies fever, chills, sore throat, cough, shortness of breath, vomiting, diarrhea, rash, or other symptoms.  Patient has attempted treatment at home with OTC cold medication.  The history is provided by the patient.    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 Authorizing Provider  ABSORICA LD 24 MG CAPS Take 2 capsules by mouth daily. Patient not taking: Reported on 04/20/2020 12/16/19   [provider]  ABSORICA LD 24 MG CAPS Take 2 capsules by mouth daily. Patient not taking: Reported on 04/20/2020 02/24/20   Willeen Niece, MD  ACZONE 7.5 % GEL APPLY A THIN LAYER TO FACE IN THE MORNING, WASH OFF AT BEDTIME Patient not taking: Reported on 04/20/2020 11/11/18   [provider]  ADDERALL XR 20 MG 24 hr capsule Take 20 mg by mouth daily. Patient not taking: Reported on 04/20/2020 09/16/18   [provider]  amphetamine-dextroamphetamine (ADDERALL) 15 MG tablet TAKE 1 TABLET BY MOUTH IN AM Patient not taking: Reported on 04/20/2020 09/16/18   [provider]  azithromycin (ZITHROMAX) 250 MG tablet Take 1 tablet (250 mg total) by mouth daily. Take first 2 tablets together, then 1 every day until finished. 07/08/20   Mickie Bail, NP  ciclopirox (LOPROX) 0.77 % cream Apply  a small amount to affected area every night  to fingernail and cover with vaseline and bandaid Patient not taking: Reported on 04/20/2020 12/16/19   [provider]  hydrochlorothiazide (MICROZIDE) 12.5 MG capsule Take 12.5 mg by mouth daily.    [provider]  hydrocortisone 2.5 % cream Apply to affected areas at face 1-2 times daily as needed for rash up to 1 week. Patient not taking: Reported on 04/20/2020 01/21/20   Neale Burly, [place], MD  loratadine (CLARITIN) 10 MG tablet Take 10 mg by mouth daily.    [provider]  phentermine (ADIPEX-P) 37.5 MG tablet Take 37.5 mg by mouth daily before breakfast. Patient not taking: Reported on 04/20/2020    [provider]  triamcinolone (KENALOG) 0.025 %  ointment Apply  a small amount to affected area twice a day  Avoid face, groin, underarms Patient not taking: Reported on 04/20/2020 08/19/19   [provider]  triamterene-hydrochlorothiazide (MAXZIDE-25) 37.5-25 MG tablet Take 1 tablet by mouth daily. 04/20/20   Corky Downs, MD    Family History Family History  Adopted: Yes    Social History Social History   Tobacco Use   Smoking status: Current Every Day Smoker    Packs/day: 0.50    Types: Cigarettes   Smokeless tobacco: Never Used  Vaping Use   Vaping Use: Never used  Substance Use Topics   Alcohol use: Yes    Alcohol/week: 10.0 standard drinks    Types: 10 Standard drinks or equivalent per week    Comment: occ   Drug use: Not Currently     Allergies   Penicillins and Sulfa antibiotics   Review of  Systems Review of Systems  Constitutional: Negative for chills and fever.  HENT: Positive for congestion, postnasal drip, rhinorrhea and sinus pressure. Negative for ear pain and sore throat.   Eyes: Negative for pain and visual disturbance.  Respiratory: Negative for cough and shortness of breath.   Cardiovascular: Negative for chest pain and palpitations.  Gastrointestinal: Negative for abdominal pain, diarrhea and vomiting.  Genitourinary: Negative for dysuria and hematuria.  Musculoskeletal: Negative for arthralgias and back pain.  Skin: Negative for color change and rash.  Neurological: Negative for seizures and syncope.  All other systems reviewed and are negative.    Physical Exam Triage Vital Signs ED Triage Vitals [07/08/20 1714]  Enc Vitals Group     BP      Pulse      Resp      Temp      Temp src      SpO2      Weight      Height      Head Circumference      Peak Flow      Pain Score 3     Pain Loc      Pain Edu?      Excl. in GC?    No data found.  Updated Vital Signs BP (!) 144/83    Pulse 90    Temp 99.4 F (37.4 C)    Resp 14    SpO2 97%   Visual Acuity Right Eye Distance:   Left Eye Distance:   Bilateral Distance:    Right Eye Near:   Left Eye Near:    Bilateral Near:     Physical Exam Vitals and nursing note reviewed.  Constitutional:      General: She is not in acute distress.    Appearance: She is well-developed. She is not ill-appearing.  HENT:     Head: Normocephalic and atraumatic.     Right Ear: Tympanic membrane normal.     Left Ear: Tympanic membrane normal.     Nose: Congestion and rhinorrhea present.     Mouth/Throat:     Mouth: Mucous membranes are moist.     Pharynx: Oropharynx is clear.  Eyes:     Conjunctiva/sclera: Conjunctivae normal.  Cardiovascular:     Rate and Rhythm: Normal rate and regular rhythm.     Heart sounds: No murmur heard.   Pulmonary:     Effort: Pulmonary effort is normal. No respiratory distress.       Breath sounds: Normal breath sounds.  Abdominal:     Palpations: Abdomen is soft.  Tenderness: There is no abdominal tenderness. There is no guarding or rebound.  Musculoskeletal:     Cervical back: Neck supple.  Skin:    General: Skin is warm and dry.     Findings: No rash.  Neurological:     General: No focal deficit present.     Mental Status: She is alert and oriented to person, place, and time.     Gait: Gait normal.  Psychiatric:        Mood and Affect: Mood normal.        Behavior: Behavior normal.      UC Treatments / Results  Labs (all labs ordered are listed, but only abnormal results are displayed) Labs Reviewed - No data to display  EKG   Radiology No results found.  Procedures Procedures (including critical care time)  Medications Ordered in UC Medications - No data to display  Initial Impression / Assessment and Plan / UC Course  I have reviewed the triage vital signs and the nursing notes.  Pertinent labs & imaging results that were available during my care of the patient were reviewed by me and considered in my medical decision making (see chart for details).   Acute sinusitis.  Treating with Zithromax, Mucinex, ibuprofen.  Counseled patient not to take OTC cold medication as she has hypertension.  Instructed her to follow-up with her PCP if her symptoms are not improving.  Patient agrees to plan of care.   Final Clinical Impressions(s) / UC Diagnoses   Final diagnoses:  Acute non-recurrent maxillary sinusitis     Discharge Instructions     Take the antibiotic as directed.    Follow up with your primary care provider if your symptoms are not improving.       ED Prescriptions    Medication Sig Dispense Auth. Provider   azithromycin (ZITHROMAX) 250 MG tablet Take 1 tablet (250 mg total) by mouth daily. Take first 2 tablets together, then 1 every day until finished. 6 tablet Mickie Bail, NP     I have reviewed the PDMP during  this encounter.   Mickie Bail, NP 07/08/20 1733

## 2020-07-08 NOTE — ED Triage Notes (Signed)
Patient complains of right ear pain that radiates down into the jaw.

## 2020-07-08 NOTE — Discharge Instructions (Addendum)
Take the antibiotic as directed.  Follow up with your primary care provider if your symptoms are not improving.     

## 2020-07-18 ENCOUNTER — Ambulatory Visit (INDEPENDENT_AMBULATORY_CARE_PROVIDER_SITE_OTHER): Payer: Medicaid Other | Admitting: Adult Health

## 2020-07-18 ENCOUNTER — Encounter: Payer: Self-pay | Admitting: Adult Health

## 2020-07-18 DIAGNOSIS — Z5329 Procedure and treatment not carried out because of patient's decision for other reasons: Secondary | ICD-10-CM

## 2020-07-18 DIAGNOSIS — Z0000 Encounter for general adult medical examination without abnormal findings: Secondary | ICD-10-CM | POA: Insufficient documentation

## 2020-07-18 DIAGNOSIS — Z91199 Patient's noncompliance with other medical treatment and regimen due to unspecified reason: Secondary | ICD-10-CM | POA: Insufficient documentation

## 2020-07-18 NOTE — Progress Notes (Signed)
No show for office visit.  

## 2020-11-30 DIAGNOSIS — E119 Type 2 diabetes mellitus without complications: Secondary | ICD-10-CM | POA: Diagnosis not present

## 2020-11-30 DIAGNOSIS — M79661 Pain in right lower leg: Secondary | ICD-10-CM | POA: Diagnosis not present

## 2020-11-30 DIAGNOSIS — I739 Peripheral vascular disease, unspecified: Secondary | ICD-10-CM | POA: Diagnosis not present

## 2020-11-30 DIAGNOSIS — G608 Other hereditary and idiopathic neuropathies: Secondary | ICD-10-CM | POA: Diagnosis not present

## 2020-11-30 DIAGNOSIS — E669 Obesity, unspecified: Secondary | ICD-10-CM | POA: Diagnosis not present

## 2020-11-30 DIAGNOSIS — D519 Vitamin B12 deficiency anemia, unspecified: Secondary | ICD-10-CM | POA: Diagnosis not present

## 2020-11-30 DIAGNOSIS — E039 Hypothyroidism, unspecified: Secondary | ICD-10-CM | POA: Diagnosis not present

## 2020-11-30 DIAGNOSIS — E559 Vitamin D deficiency, unspecified: Secondary | ICD-10-CM | POA: Diagnosis not present

## 2020-11-30 DIAGNOSIS — F909 Attention-deficit hyperactivity disorder, unspecified type: Secondary | ICD-10-CM | POA: Diagnosis not present

## 2020-11-30 DIAGNOSIS — I10 Essential (primary) hypertension: Secondary | ICD-10-CM | POA: Diagnosis not present

## 2020-11-30 DIAGNOSIS — G603 Idiopathic progressive neuropathy: Secondary | ICD-10-CM | POA: Diagnosis not present

## 2021-01-05 DIAGNOSIS — Z308 Encounter for other contraceptive management: Secondary | ICD-10-CM | POA: Diagnosis not present

## 2021-01-05 DIAGNOSIS — I10 Essential (primary) hypertension: Secondary | ICD-10-CM | POA: Diagnosis not present

## 2021-01-05 DIAGNOSIS — Z30432 Encounter for removal of intrauterine contraceptive device: Secondary | ICD-10-CM | POA: Diagnosis not present

## 2021-01-23 ENCOUNTER — Other Ambulatory Visit (INDEPENDENT_AMBULATORY_CARE_PROVIDER_SITE_OTHER): Payer: Self-pay | Admitting: Vascular Surgery

## 2021-01-23 DIAGNOSIS — I739 Peripheral vascular disease, unspecified: Secondary | ICD-10-CM

## 2021-01-26 ENCOUNTER — Other Ambulatory Visit: Payer: Self-pay

## 2021-01-26 ENCOUNTER — Ambulatory Visit (INDEPENDENT_AMBULATORY_CARE_PROVIDER_SITE_OTHER): Payer: Medicaid Other | Admitting: Vascular Surgery

## 2021-01-26 ENCOUNTER — Ambulatory Visit (INDEPENDENT_AMBULATORY_CARE_PROVIDER_SITE_OTHER): Payer: Medicaid Other

## 2021-01-26 DIAGNOSIS — M79604 Pain in right leg: Secondary | ICD-10-CM

## 2021-01-26 DIAGNOSIS — M79605 Pain in left leg: Secondary | ICD-10-CM

## 2021-01-26 DIAGNOSIS — I739 Peripheral vascular disease, unspecified: Secondary | ICD-10-CM | POA: Diagnosis not present

## 2021-01-26 DIAGNOSIS — I7300 Raynaud's syndrome without gangrene: Secondary | ICD-10-CM | POA: Diagnosis not present

## 2021-01-26 NOTE — Progress Notes (Signed)
MRN : [id number]  Amy Frye is a 40 y.o. (Jun 25, 1981) female who presents with chief complaint of No chief complaint on file. Marland Kitchen  History of Present Illness:   Chief complaint: abnormal vascular test obtained after patient noted some pain and discoloration of her forefeet  Location: Both feet equally Character/quality of the symptom: She describes it as an aching not really sharp but intense Severity: Moderate to severe Duration: Lasting hours at a time Timing/onset: Intermittent does seem to correlate with cold and stress Aggravating/context: Cold and stress Relieving/modifying: Have not really found any factors that make it go away rapidly  ABIs obtained in the office today are normal bilaterally with triphasic signals throughout  No outpatient medications have been marked as taking for the 01/26/21 encounter (Appointment) with Gilda Crease, Latina Craver, MD.   Current Facility-Administered Medications for the 01/26/21 encounter (Appointment) with Gilda Crease, Latina Craver, MD  Medication  . etonogestrel (NEXPLANON) implant 68 mg    Past Medical History:  Diagnosis Date  . Hypertension     Past Surgical History:  Procedure Laterality Date  . BREAST SURGERY  2017   Lift/ Enhancement bilateral  . CESAREAN SECTION  04/10/2003    Social History Social History   Tobacco Use  . Smoking status: Current Every Day Smoker    Packs/day: 0.50    Types: Cigarettes  . Smokeless tobacco: Never Used  Vaping Use  . Vaping Use: Never used  Substance Use Topics  . Alcohol use: Yes    Alcohol/week: 10.0 standard drinks    Types: 10 Standard drinks or equivalent per week    Comment: occ  . Drug use: Not Currently    Family History Family History  Adopted: Yes  No family history of bleeding/clotting disorders, porphyria or autoimmune disease   Allergies  Allergen Reactions  . Penicillins Other (See Comments)    unknown  . Sulfa Antibiotics Swelling     REVIEW OF SYSTEMS  (Negative unless checked)  Constitutional: [] Weight loss  [] Fever  [] Chills Cardiac: [] Chest pain   [] Chest pressure   [] Palpitations   [] Shortness of breath when laying flat   [] Shortness of breath with exertion. Vascular:  [] Pain in legs with walking   [x] Pain in legs at rest  [] History of DVT   [] Phlebitis   [] Swelling in legs   [] Varicose veins   [] Non-healing ulcers Pulmonary:   [] Uses home oxygen   [] Productive cough   [] Hemoptysis   [] Wheeze  [] COPD   [] Asthma Neurologic:  [] Dizziness   [] Seizures   [] History of stroke   [] History of TIA  [] Aphasia   [] Vissual changes   [] Weakness or numbness in arm   [] Weakness or numbness in leg Musculoskeletal:   [] Joint swelling   [] Joint pain   [] Low back pain Hematologic:  [] Easy bruising  [] Easy bleeding   [] Hypercoagulable state   [] Anemic Gastrointestinal:  [] Diarrhea   [] Vomiting  [] Gastroesophageal reflux/heartburn   [] Difficulty swallowing. Genitourinary:  [] Chronic kidney disease   [] Difficult urination  [] Frequent urination   [] Blood in urine Skin:  [] Rashes   [] Ulcers  Psychological:  [] History of anxiety   []  History of major depression.  Physical Examination  There were no vitals filed for this visit. There is no height or weight on file to calculate BMI. Gen: WD/WN, NAD Head: [place]/AT, No temporalis wasting.  Ear/Nose/Throat: Hearing grossly intact, nares w/o erythema or drainage, poor dentition Eyes: PER, EOMI, sclera nonicteric.  Neck: Supple, no masses.  No bruit or JVD.  Pulmonary:  Good air movement, clear to auscultation bilaterally, no use of accessory muscles.  Cardiac: RRR, normal S1, S2, no Murmurs. Vascular: Mild cyanosis with dependency Vessel Right Left  Radial Palpable Palpable  PT Palpable Palpable  DP Palpable Palpable  Gastrointestinal: soft, non-distended. No guarding/no peritoneal signs.  Musculoskeletal: M/S 5/5 throughout.  No deformity or atrophy.  Neurologic: CN 2-12 intact. Pain and light touch intact in  extremities.  Symmetrical.  Speech is fluent. Motor exam as listed above. Psychiatric: Judgment intact, Mood & affect appropriate for pt's clinical situation. Dermatologic: No rashes or ulcers noted.  No changes consistent with cellulitis. Lymph : No Cervical lymphadenopathy, no lichenification or skin changes of chronic lymphedema.  CBC Lab Results  Component Value Date   WBC 11.8 (H) 03/21/2015   HGB 12.8 03/21/2015   HCT 37.8 03/21/2015   MCV 94.6 03/21/2015   PLT 139 (L) 03/21/2015    BMET    Component Value Date/Time   NA 130 (L) 03/21/2015 1716   NA 141 10/05/2012 0144   K 2.3 (LL) 03/21/2015 1716   K 2.7 (L) 10/05/2012 0144   CL 93 (L) 03/21/2015 1716   CL 105 10/05/2012 0144   CO2 26 03/21/2015 1716   CO2 29 10/05/2012 0144   GLUCOSE 82 03/21/2015 1716   GLUCOSE 102 (H) 10/05/2012 0144   BUN 26 (H) 03/21/2015 1716   BUN 11 10/05/2012 0144   CREATININE 1.42 (H) 03/21/2015 1716   CREATININE 0.82 10/05/2012 0144   CALCIUM 8.3 (L) 03/21/2015 1716   CALCIUM 8.3 (L) 10/05/2012 0144   GFRNONAA 48 (L) 03/21/2015 1716   GFRNONAA >60 10/05/2012 0144   GFRAA 56 (L) 03/21/2015 1716   GFRAA >60 10/05/2012 0144   CrCl cannot be calculated (Patient's most recent lab result is older than the maximum 21 days allowed.).  COAG No results found for: INR, PROTIME  Radiology No results found.   Assessment/Plan 1. PAD (peripheral artery disease) (HCC) Recommend:  I do not find evidence of life style limiting vascular disease. The patient specifically denies life style limitation.  Previous noninvasive studies including ABI's of the legs do not identify critical vascular problems.  The patient should continue walking and begin a more formal exercise program. The patient should continue his antiplatelet therapy and aggressive treatment of the lipid abnormalities.  The patient should begin wearing graduated compression socks 15-20 mmHg strength to control her mild  edema.  Patient will follow-up with me on a PRN basis   2. Pain in both lower extremities Recommend:  I do not find evidence of life style limiting vascular disease. The patient specifically denies life style limitation.  Previous noninvasive studies including ABI's of the legs do not identify critical vascular problems.  The patient should continue walking and begin a more formal exercise program. The patient should continue his antiplatelet therapy and aggressive treatment of the lipid abnormalities.  The patient should begin wearing graduated compression socks 15-20 mmHg strength to control her mild edema.  Patient will follow-up with me on a PRN basis   3. Raynaud's disease without gangrene Recommend:  The patient is currently tolerating the Raynaud's changes fairly well. Lengthy discussion regarding keeping the feet and the hands warm; gloves, washing with only warm water (especially avoiding cold water immersion) and using wool socks, specifically Smartwool was recommended.  Possibility of using Norvasc was discussed but it was decided to hold off for now until the benefits of conservative therapy is assessed.  The patient will follow up  PRN if the changes worsen or persist.          Levora Dredge, MD  01/26/2021 10:35 AM

## 2021-02-08 ENCOUNTER — Encounter (INDEPENDENT_AMBULATORY_CARE_PROVIDER_SITE_OTHER): Payer: Self-pay | Admitting: Vascular Surgery

## 2021-02-08 DIAGNOSIS — M79606 Pain in leg, unspecified: Secondary | ICD-10-CM | POA: Insufficient documentation

## 2021-02-08 DIAGNOSIS — I7300 Raynaud's syndrome without gangrene: Secondary | ICD-10-CM | POA: Insufficient documentation

## 2021-07-12 ENCOUNTER — Other Ambulatory Visit: Payer: Self-pay | Admitting: Adult Health

## 2021-07-12 ENCOUNTER — Other Ambulatory Visit: Payer: Self-pay | Admitting: Internal Medicine

## 2021-07-12 DIAGNOSIS — N63 Unspecified lump in unspecified breast: Secondary | ICD-10-CM

## 2021-07-12 DIAGNOSIS — Z1231 Encounter for screening mammogram for malignant neoplasm of breast: Secondary | ICD-10-CM

## 2021-07-14 ENCOUNTER — Inpatient Hospital Stay: Admission: RE | Admit: 2021-07-14 | Payer: Medicaid Other | Source: Ambulatory Visit

## 2021-07-14 ENCOUNTER — Other Ambulatory Visit: Payer: Medicaid Other

## 2021-07-19 ENCOUNTER — Other Ambulatory Visit: Payer: Medicaid Other

## 2021-07-24 ENCOUNTER — Ambulatory Visit
Admission: RE | Admit: 2021-07-24 | Discharge: 2021-07-24 | Disposition: A | Discharge status: Home / Self Care | Payer: Medicaid Other | Source: Ambulatory Visit | Attending: Adult Health | Admitting: Adult Health

## 2021-07-24 ENCOUNTER — Other Ambulatory Visit: Payer: Self-pay | Admitting: Adult Health

## 2021-07-24 ENCOUNTER — Other Ambulatory Visit: Payer: Self-pay

## 2021-07-24 DIAGNOSIS — N63 Unspecified lump in unspecified breast: Secondary | ICD-10-CM

## 2021-07-24 DIAGNOSIS — N6315 Unspecified lump in the right breast, overlapping quadrants: Secondary | ICD-10-CM | POA: Insufficient documentation

## 2021-11-17 ENCOUNTER — Other Ambulatory Visit: Payer: Self-pay | Admitting: Adult Health

## 2021-11-17 DIAGNOSIS — N6001 Solitary cyst of right breast: Secondary | ICD-10-CM

## 2021-12-12 ENCOUNTER — Encounter: Payer: Self-pay | Admitting: *Deleted

## 2021-12-12 ENCOUNTER — Emergency Department: Payer: Medicaid Other

## 2021-12-12 ENCOUNTER — Emergency Department
Admission: EM | Admit: 2021-12-12 | Discharge: 2021-12-12 | Disposition: A | Discharge status: Home / Self Care | Payer: Medicaid Other | Attending: Emergency Medicine | Admitting: Emergency Medicine

## 2021-12-12 ENCOUNTER — Other Ambulatory Visit: Payer: Self-pay

## 2021-12-12 DIAGNOSIS — W232XXA Caught, crushed, jammed or pinched between a moving and stationary object, initial encounter: Secondary | ICD-10-CM | POA: Diagnosis not present

## 2021-12-12 DIAGNOSIS — S62639B Displaced fracture of distal phalanx of unspecified finger, initial encounter for open fracture: Secondary | ICD-10-CM

## 2021-12-12 DIAGNOSIS — S62633B Displaced fracture of distal phalanx of left middle finger, initial encounter for open fracture: Secondary | ICD-10-CM | POA: Diagnosis not present

## 2021-12-12 DIAGNOSIS — S6992XA Unspecified injury of left wrist, hand and finger(s), initial encounter: Secondary | ICD-10-CM | POA: Diagnosis present

## 2021-12-12 MED ORDER — FLUCONAZOLE 150 MG PO TABS: 150.0000 mg | ORAL_TABLET | Freq: Once | ORAL | 0 refills | Status: AC

## 2021-12-12 MED ORDER — CEPHALEXIN 500 MG PO CAPS: 1000.0000 mg | ORAL_CAPSULE | Freq: Two times a day (BID) | ORAL | 0 refills | Status: DC

## 2021-12-12 MED ORDER — DOXYCYCLINE HYCLATE 100 MG PO TABS: 100.0000 mg | ORAL_TABLET | Freq: Two times a day (BID) | ORAL | 0 refills | Status: DC

## 2021-12-12 NOTE — ED Provider Notes (Addendum)
[facility] Provider Note  Patient Contact: 9:23 PM (approximate)   History   Finger Injury   HPI  Amy Frye is a 41 y.o. female who presents the emergency department for evaluation of an injury to the middle finger of the patient's left hand.  She accidentally closed the tip of her finger in a door this evening.  She sustained soft tissue injury and is afraid that she may have dislodged her proximal nailbed of her middle finger.  Up-to-date on tetanus immunization.  She is able to flex and extend the digit at this time.  No other injury or complaint.     Physical Exam   Triage Vital Signs: ED Triage Vitals  Enc Vitals Group     BP 12/12/21 1933 127/83     Pulse Rate 12/12/21 1933 79     Resp 12/12/21 1933 18     Temp 12/12/21 1933 99.3 F (37.4 C)     Temp src --      SpO2 12/12/21 1933 100 %     Weight 12/12/21 1930 175 lb (79.4 kg)     Height 12/12/21 1930 5\' 8"  (1.727 m)     Head Circumference --      Peak Flow --      Pain Score 12/12/21 1930 10     Pain Loc --      Pain Edu? --      Excl. in GC? --     Most recent vital signs: Vitals:   12/12/21 1933 12/12/21 2252  BP: 127/83 122/78  Pulse: 79 77  Resp: 18 18  Temp: 99.3 F (37.4 C)   SpO2: 100% 100%     General: Alert and in no acute distress.  Cardiovascular:  Good peripheral perfusion Respiratory: Normal respiratory effort without tachypnea or retractions. Lungs CTAB.  Musculoskeletal: Full range of motion to all extremities.  Visualization of the middle finger of the left hand reveals a small 0.5 cm laceration along the lateral nailbed as well as slight tenting of the skin at the proximal nailbed.  Patient has large artificial nails in place.  Nail is appropriately attached to the nailbed at this time.  There is no active bleeding.  No visible foreign body.  She is able to extend and flex the finger at this time.  Sensation and capillary refill intact. Neurologic:  No  gross focal neurologic deficits are appreciated.  Skin:   No rash noted Other:   ED Results / Procedures / Treatments   Labs (all labs ordered are listed, but only abnormal results are displayed) Labs Reviewed - No data to display   EKG     RADIOLOGY  I personally viewed and evaluated these images as part of my medical decision making, as well as reviewing the written report by the radiologist.  ED Provider Interpretation: Visualization of the middle finger reveals subtle calcification dorsal to the tuft resulting in a possible avulsion fracture in this area.  Patient also has hypodensity below the nailbed which after reviewing physical exam and imaging I suspect is hematoma versus slight amount of air from her proximal nailbed injury.  DG Finger Middle Left  Result Date: 12/12/2021 CLINICAL DATA:  Laceration of the tip of the middle finger after an injury in a door today. EXAM: LEFT MIDDLE FINGER 2+V COMPARISON:  Procede FINDINGS: Faint calcification dorsal to the tuft of the distal phalanx is suspicious for a tiny avulsion. Moreover, there is a suggestions of lucency  under the nail favoring gas in the setting of a traumatic nail bed injury. IMPRESSION: 1. Hypodensity favoring gas along the nail bed, compatible with traumatic nail injury. 2. Subtle calcification dorsal to the tuft of the distal phalanx of the finger, suspicious for a subtle avulsion. Electronically Signed   By: Gaylyn Rong M.D.   On: 12/12/2021 20:29    PROCEDURES:  Critical Care performed: No  ..Laceration Repair  Date/Time: 12/12/2021 11:41 PM Performed by: Racheal Patches, PA-C Authorized by: Racheal Patches, PA-C   Consent:    Consent obtained:  Verbal   Consent given by:  Patient   Risks discussed:  Infection, pain, poor wound healing and need for additional repair Universal protocol:    Procedure explained and questions answered to patient or proxy's satisfaction: yes     Patient  identity confirmed:  Verbally with patient Anesthesia:    Anesthesia method:  None Laceration details:    Location:  Finger   Finger location:  L long finger   Length (cm):  0.5 Exploration:    Wound exploration: entire depth of wound visualized     Wound extent: underlying fracture     Wound extent: no foreign bodies/material noted and no tendon damage noted   Treatment:    Area cleansed with:  Saline and Shur-Clens   Amount of cleaning:  Extensive   Irrigation solution:  Sterile saline   Irrigation volume:  2 L Repair type:    Repair type:  Simple Post-procedure details:    Dressing:  Splint for protection and tube gauze   Procedure completion:  Tolerated well, no immediate complications   MEDICATIONS ORDERED IN ED: Medications - No data to display   IMPRESSION / MDM / ASSESSMENT AND PLAN / ED COURSE  I reviewed the triage vital signs and the nursing notes.                              Differential diagnosis includes, but is not limited to, fracture, laceration, nailbed injury     Patient's diagnosis is consistent with avulsion fracture, open to the middle finger.  Patient presented to the emergency department after sustaining an injury to the middle finger of her left hand.  She accidentally closed the door on the end of her finger.  There is a subtle calcification identified on x-ray that may be an avulsion fracture.  Given the mechanism I think it is likely remote, however I will treat for an open fracture given this finding with overlying soft tissue injury.  Patient has a very small laceration along the lateral aspect of the nail with tenting of the proximal nailbed.  We discussed removal of the patient's acrylic nail as well as this likely leading to removal of her nail.  Patient would prefer to splint this area after thorough cleaning.  We cleansed the area, dressed it and applied splint.  Given the location and the shallow nature of the laceration this was not primarily  closed.  Again her nail is attached at this time and we have left this in place.  Recommend follow-up with orthopedics.  Wound and splint care discussed with the patient.  Return precautions discussed with the patient.  She will be placed on antibiotics prophylactically given the possibility of underlying fracture.  Tylenol and Motrin for pain.  Patient will have a prescription for Diflucan should she develop symptoms of yeast infection.  Encouraged to use probiotics.  Patient is given ED precautions to return to the ED for any worsening or new symptoms.    Clinical Course as of 12/12/21 2344  Tue Dec 12, 2021  2120 DG Finger Middle Left [KL]    Clinical Course User Index [KL] Nancy Marus, Student-PA     FINAL CLINICAL IMPRESSION(S) / ED DIAGNOSES   Final diagnoses:  Open fracture of tuft of distal phalanx of finger     Rx / DC Orders   ED Discharge Orders          Ordered    doxycycline (VIBRA-TABS) 100 MG tablet  2 times daily        12/12/21 2246    cephALEXin (KEFLEX) 500 MG capsule  2 times daily        12/12/21 2246    fluconazole (DIFLUCAN) 150 MG tablet   Once        12/12/21 2246             Note:  This document was prepared using Dragon voice recognition software and may include unintentional dictation errors.   Racheal Patches, PA-C 12/12/21 2343    Racheal Patches, PA-C 12/12/21 2344    Shaune Pollack, MD 12/16/21 1110

## 2021-12-12 NOTE — ED Triage Notes (Signed)
Pt slammed left 3rd finger in a metal door today.  Pt has a laceration to tip of finger.  Bleeding controlled.  Pt alert.

## 2022-01-23 ENCOUNTER — Other Ambulatory Visit: Payer: Medicaid Other

## 2022-01-23 ENCOUNTER — Inpatient Hospital Stay: Admission: RE | Admit: 2022-01-23 | Payer: Medicaid Other | Source: Ambulatory Visit

## 2022-01-24 ENCOUNTER — Ambulatory Visit
Admission: RE | Admit: 2022-01-24 | Discharge: 2022-01-24 | Disposition: A | Discharge status: Home / Self Care | Payer: Medicaid Other | Source: Ambulatory Visit | Attending: Adult Health | Admitting: Adult Health

## 2022-01-24 DIAGNOSIS — N6001 Solitary cyst of right breast: Secondary | ICD-10-CM | POA: Diagnosis present

## 2022-07-20 ENCOUNTER — Other Ambulatory Visit: Payer: Self-pay | Admitting: Adult Health

## 2022-07-20 DIAGNOSIS — N63 Unspecified lump in unspecified breast: Secondary | ICD-10-CM

## 2022-08-08 ENCOUNTER — Ambulatory Visit
Admission: RE | Admit: 2022-08-08 | Discharge: 2022-08-08 | Disposition: A | Discharge status: Home / Self Care | Payer: Medicaid Other | Source: Ambulatory Visit | Attending: Adult Health | Admitting: Adult Health

## 2022-08-08 ENCOUNTER — Other Ambulatory Visit: Payer: Self-pay | Admitting: Adult Health

## 2022-08-08 DIAGNOSIS — N63 Unspecified lump in unspecified breast: Secondary | ICD-10-CM

## 2022-08-09 ENCOUNTER — Other Ambulatory Visit: Payer: Self-pay | Admitting: Adult Health

## 2022-08-13 ENCOUNTER — Encounter (INDEPENDENT_AMBULATORY_CARE_PROVIDER_SITE_OTHER): Payer: Self-pay

## 2022-08-14 ENCOUNTER — Other Ambulatory Visit: Payer: Self-pay | Admitting: Adult Health

## 2022-08-14 DIAGNOSIS — N63 Unspecified lump in unspecified breast: Secondary | ICD-10-CM

## 2022-08-14 DIAGNOSIS — R928 Other abnormal and inconclusive findings on diagnostic imaging of breast: Secondary | ICD-10-CM

## 2022-08-23 ENCOUNTER — Inpatient Hospital Stay: Admission: RE | Admit: 2022-08-23 | Payer: Medicaid Other | Source: Ambulatory Visit

## 2022-09-05 ENCOUNTER — Ambulatory Visit
Admission: EM | Admit: 2022-09-05 | Discharge: 2022-09-05 | Disposition: A | Discharge status: Home / Self Care | Payer: Medicaid Other | Attending: Family Medicine | Admitting: Family Medicine

## 2022-09-05 DIAGNOSIS — I10 Essential (primary) hypertension: Secondary | ICD-10-CM | POA: Diagnosis not present

## 2022-09-05 DIAGNOSIS — J019 Acute sinusitis, unspecified: Secondary | ICD-10-CM

## 2022-09-05 DIAGNOSIS — R609 Edema, unspecified: Secondary | ICD-10-CM

## 2022-09-05 MED ORDER — TRIAMTERENE-HCTZ 37.5-25 MG PO TABS: 1.0000 | ORAL_TABLET | Freq: Every day | ORAL | 0 refills | Status: DC

## 2022-09-05 MED ORDER — DOXYCYCLINE HYCLATE 100 MG PO CAPS: 100.0000 mg | ORAL_CAPSULE | Freq: Two times a day (BID) | ORAL | 0 refills | Status: DC

## 2022-09-05 MED ORDER — AZITHROMYCIN 250 MG PO TABS: ORAL_TABLET | ORAL | 0 refills | Status: DC

## 2022-09-05 NOTE — Discharge Instructions (Signed)
Take doxycycline 100 mg --1 capsule 2 times daily for 7 days  Follow-up with your primary care for further refills on your Maxzide

## 2022-09-05 NOTE — ED Provider Notes (Addendum)
Amy Frye    CSN: [id number] Arrival date & time: 09/05/22  1900      History   Chief Complaint Chief Complaint  Patient presents with   Nasal Congestion    Also swollen eyes and small fever on and off for over a week. And have high blood pressure/ hypertension and been out of my triameterine blood pressure meds for a week and am swelling in my legs and feet and ankles. - Entered by patient    HPI Amy Frye is a 41 y.o. female.   HPI Here for nasal congestion and rhinorrhea and facial pressure.  Symptoms have been going on for 8 to 10 days.  She has had some low-grade temps as high as 100 point something.  Nausea or vomiting or diarrhea.  She has had a little cough.   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 Authorizing Provider  doxycycline (VIBRAMYCIN) 100 MG capsule Take 1 capsule (100 mg total) by mouth 2 (two) times daily for 7 days. 09/05/22 09/12/22 Yes Zenia Resides, MD  ABSORICA LD 24 MG CAPS Take 2 capsules by mouth daily.  12/16/19   [provider]  ABSORICA LD 24 MG CAPS Take 2 capsules by mouth daily. 02/24/20   Willeen Niece, MD  ACZONE 7.5 % GEL APPLY A THIN LAYER TO FACE IN THE MORNING, WASH OFF AT BEDTIME 11/11/18   [provider]  ADDERALL XR 20 MG 24 hr capsule Take 20 mg by mouth daily. 09/16/18   [provider]  albuterol (VENTOLIN HFA) 108 (90 Base) MCG/ACT inhaler INHALE 1-2 PUFFS BY MOUTH EVERY FOUR TO SIX HOURS AS NEEDED 12/06/20   [provider]  amphetamine-dextroamphetamine (ADDERALL) 15 MG tablet TAKE 1 TABLET BY MOUTH IN AM 09/16/18   [provider]  budesonide-formoterol (SYMBICORT) 80-4.5 MCG/ACT inhaler Inhale into the lungs. 12/06/20   [provider]  ciclopirox (LOPROX) 0.77 % cream Apply  a small amount to affected area every night  to fingernail and cover with vaseline and bandaid 12/16/19   [provider]  ferrous sulfate 325 (65 FE) MG tablet Take by mouth.    [provider]  folic acid (FOLVITE) 1 MG tablet Take by mouth.  [provider]  hydrocortisone 2.5 % cream Apply to affected areas at face 1-2 times daily as needed for rash up to 1 week. 01/21/20   Moye, [place], MD  loratadine (CLARITIN) 10 MG tablet Take 10 mg by mouth daily.    [provider]  metFORMIN (GLUCOPHAGE) 500 MG tablet  01/08/21   [provider]  Multiple Vitamin (MULTI-VITAMIN) tablet Take 1 tablet by mouth daily.    [provider]  nicotine (NICODERM CQ - DOSED IN MG/24 HOURS) 14 mg/24hr patch Place 14 mg onto the skin daily. 12/26/20   [provider]  phentermine (ADIPEX-P) 37.5 MG tablet Take 37.5 mg by mouth daily before breakfast.    [provider]  Tiotropium Bromide Monohydrate (SPIRIVA RESPIMAT) 1.25 MCG/ACT AERS INHALE 2 PUFFS BY MOUTH EVERY DAY 12/06/20   [provider]  triamcinolone (KENALOG) 0.025 % ointment Apply  a small amount to affected area twice a day  Avoid  face, groin, underarms 08/19/19   [provider]  triamterene-hydrochlorothiazide (MAXZIDE-25) 37.5-25 MG tablet Take 1 tablet by mouth daily. 09/05/22   Zenia Resides, MD    Family History Family History  Adopted: Yes    Social History Social History   Tobacco Use   Smoking status: Every Day    Packs/day: 0.50    Types: Cigarettes   Smokeless tobacco: Never  Vaping Use   Vaping Use: Never used  Substance Use Topics   Alcohol use: Yes    Alcohol/week: 10.0 standard drinks of alcohol    Types: 10 Standard drinks or equivalent per week    Comment: occ   Drug use: Not Currently     Allergies   Penicillins and Sulfa antibiotics   Review of Systems Review of Systems   Physical Exam Triage Vital Signs ED Triage Vitals  Enc Vitals Group     BP 09/05/22 2007 (!) 158/99     Pulse Rate 09/05/22 2007 90     Resp 09/05/22 2007 18     Temp 09/05/22 2007 97.9 F (36.6 C)     Temp src --      SpO2 09/05/22 2007 98 %     Weight --      Height 09/05/22 2008 5\' 8"  (1.727 m)     Head Circumference --      Peak Flow --      Pain Score 09/05/22 2008 2     Pain Loc --      Pain Edu? --      Excl. in GC? --    No data found.  Updated Vital Signs BP (!) 158/99   Pulse 90   Temp 97.9 F (36.6 C)   Resp 18   Ht 5\' 8"  (1.727 m)   SpO2 98%   BMI 26.61 kg/m   Visual Acuity Right Eye Distance:   Left Eye Distance:   Bilateral Distance:    Right Eye Near:   Left Eye Near:    Bilateral Near:     Physical Exam Vitals reviewed.  Constitutional:      General: She is not in acute distress.    Appearance: She is not toxic-appearing.  HENT:     Right Ear: Tympanic membrane and ear canal normal.     Left Ear: Tympanic membrane and ear canal normal.     Nose: Congestion present.     Mouth/Throat:     Mouth: Mucous membranes are moist.     Pharynx: No oropharyngeal exudate  or posterior oropharyngeal erythema.  Eyes:     Extraocular Movements:  Extraocular movements intact.     Conjunctiva/sclera: Conjunctivae normal.     Pupils: Pupils are equal, round, and reactive to light.  Cardiovascular:     Rate and Rhythm: Normal rate and regular rhythm.     Heart sounds: No murmur heard. Pulmonary:     Effort: Pulmonary effort is normal. No respiratory distress.     Breath sounds: No stridor. No wheezing, rhonchi or rales.  Musculoskeletal:     Cervical back: Neck supple.  Lymphadenopathy:     Cervical: No cervical adenopathy.  Skin:    Capillary Refill: Capillary refill takes less than 2 seconds.     Coloration: Skin is not jaundiced or pale.  Neurological:     General: No focal deficit present.     Mental Status: She is alert and oriented to person, place, and time.  Psychiatric:        Behavior: Behavior normal.      UC Treatments / Results  Labs (all labs ordered are listed, but only abnormal results are displayed) Labs Reviewed - No data to display  EKG   Radiology No results found.  Procedures Procedures (including critical care time)  Medications Ordered in UC Medications - No data to display  Initial Impression / Assessment and Plan / UC Course  I have reviewed the triage vital signs and the nursing notes.  Pertinent labs & imaging results that were available during my care of the patient were reviewed by me and considered in my medical decision making (see chart for details).        Her Maxide is refilled.  Since she has approximately 10 days of symptoms and is having sinus pressure, she is treated for acute sinusitis.  After the patient was discharged I saw that there was an interaction between her Absorica and the doxycycline.  Zithromax was sent instead and doxycycline was canceled Final Clinical Impressions(s) / UC Diagnoses   Final diagnoses:  Swelling     Discharge Instructions      Take doxycycline 100 mg --1 capsule 2 times daily for 7 days  Follow-up with your primary care for  further refills on your Maxzide     ED Prescriptions     Medication Sig Dispense Auth. Provider   doxycycline (VIBRAMYCIN) 100 MG capsule Take 1 capsule (100 mg total) by mouth 2 (two) times daily for 7 days. 14 capsule Zenia Resides, MD   triamterene-hydrochlorothiazide (MAXZIDE-25) 37.5-25 MG tablet Take 1 tablet by mouth daily. 30 tablet Jatinder Mcdonagh, Janace Aris, MD      PDMP not reviewed this encounter.   Zenia Resides, MD 09/05/22 2018    Zenia Resides, MD 09/05/22 2024

## 2022-09-05 NOTE — ED Triage Notes (Signed)
.  Patient to Urgent Care with complaints of bilateral eye swelling, nasal congestion. Reports possible fevers.   Reports she has been without her bp meds (triamtrene-hydrochlorothiazide 37.5-25mg ) for approx 1 week.

## 2022-11-10 ENCOUNTER — Ambulatory Visit
Admission: RE | Admit: 2022-11-10 | Discharge: 2022-11-10 | Disposition: A | Discharge status: Home / Self Care | Payer: Medicaid Other | Source: Ambulatory Visit | Attending: Emergency Medicine | Admitting: Emergency Medicine

## 2022-11-10 VITALS — BP 145/99 | HR 71 | Temp 98.8°F | Resp 18

## 2022-11-10 DIAGNOSIS — I10 Essential (primary) hypertension: Secondary | ICD-10-CM | POA: Diagnosis not present

## 2022-11-10 DIAGNOSIS — R609 Edema, unspecified: Secondary | ICD-10-CM

## 2022-11-10 DIAGNOSIS — Z760 Encounter for issue of repeat prescription: Secondary | ICD-10-CM | POA: Diagnosis not present

## 2022-11-10 MED ORDER — TRIAMTERENE-HCTZ 37.5-25 MG PO TABS: 1.0000 | ORAL_TABLET | Freq: Every day | ORAL | 0 refills | Status: DC

## 2022-11-10 NOTE — Discharge Instructions (Addendum)
Your blood pressure is elevated today at 145/99.  Please have this rechecked by your primary care provider in 2-4 weeks.      Please follow up with your new primary care provider for additional refills of your blood pressure medication.

## 2022-11-10 NOTE — ED Provider Notes (Signed)
[facility]    CSN: [id number] Arrival date & time: 11/10/22  1140      History   Chief Complaint Chief Complaint  Patient presents with   Medication Refill    HPI Amy Frye is a 42 y.o. female.  Patient presents with request for refill of her blood pressure medication, triamterene-HCTZ.  She has been out of this medication for 5 days. She denies chest pain, shortness of breath, focal weakness, or other symptoms.  Her medical history includes hypertension.  Patient states she does not have a PCP currently.   The history is provided by the patient and medical records.    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 Authorizing Provider  ABSORICA LD 24 MG CAPS Take 2 capsules by mouth daily. 12/16/19   [provider]  ABSORICA LD 24 MG CAPS Take 2 capsules by mouth daily. 02/24/20   Brendolyn Patty, MD  ACZONE 7.5 % GEL APPLY A THIN LAYER TO FACE IN THE MORNING, Experiment OFF AT BEDTIME 11/11/18   [provider]  ADDERALL XR 20 MG 24 hr capsule Take 20 mg by mouth daily. 09/16/18   [provider]   albuterol (VENTOLIN HFA) 108 (90 Base) MCG/ACT inhaler INHALE 1-2 PUFFS BY MOUTH EVERY FOUR TO SIX HOURS AS NEEDED 12/06/20   [provider]  amphetamine-dextroamphetamine (ADDERALL) 15 MG tablet TAKE 1 TABLET BY MOUTH IN AM 09/16/18   [provider]  azithromycin (ZITHROMAX) 250 MG tablet Take first 2 tablets together, then 1 every day until finished. 09/05/22   Barrett Henle, MD  budesonide-formoterol [facility]) 80-4.5 MCG/ACT inhaler Inhale into the lungs. 12/06/20   [provider]  ciclopirox (LOPROX) 0.77 % cream Apply  a small amount to affected area every night  to fingernail and cover with vaseline and bandaid 12/16/19   [provider]  ferrous sulfate 325 (65 FE) MG tablet Take by mouth.    [provider]  folic acid (FOLVITE) 1 MG tablet Take by mouth.    [provider]  hydrocortisone 2.5 % cream Apply to affected areas at face 1-2 times daily as needed for rash up to 1 week. 01/21/20   Moye, [place], MD  loratadine (CLARITIN) 10 MG tablet Take 10 mg by mouth daily.    [provider]  metFORMIN (GLUCOPHAGE) 500 MG tablet  01/08/21   [provider]  Multiple Vitamin (MULTI-VITAMIN) tablet Take 1 tablet by mouth daily.    [provider]  nicotine (NICODERM CQ - DOSED IN MG/24 HOURS) 14 mg/24hr patch Place 14 mg onto the skin daily. 12/26/20   [provider]  phentermine (ADIPEX-P) 37.5 MG tablet Take 37.5 mg by mouth daily before breakfast.    [provider]  Tiotropium Bromide Monohydrate (SPIRIVA RESPIMAT) 1.25 MCG/ACT AERS INHALE 2 PUFFS BY MOUTH EVERY DAY 12/06/20   [provider]  triamcinolone (KENALOG) 0.025 % ointment Apply  a small amount to affected area twice a day  Avoid face, groin, underarms 08/19/19   [provider]  triamterene-hydrochlorothiazide (MAXZIDE-25) 37.5-25 MG tablet Take 1 tablet by mouth daily. 11/10/22   Mickie Bail, NP    Family  History Family History  Adopted: Yes    Social History Social History   Tobacco Use   Smoking status: Every Day    Packs/day: 0.50    Types: Cigarettes   Smokeless tobacco: Never  Vaping Use   Vaping Use: Never used  Substance Use Topics   Alcohol use: Yes    Alcohol/week: 10.0 standard drinks of alcohol    Types: 10 Standard drinks or equivalent per week    Comment: occ   Drug use: Not Currently     Allergies   Penicillins and Sulfa antibiotics   Review of Systems Review of Systems  Respiratory:  Negative for cough and shortness of breath.   Cardiovascular:  Negative for chest pain and palpitations.  Neurological:  Negative for weakness and numbness.  All other systems reviewed and are negative.    Physical Exam Triage Vital Signs ED Triage Vitals  Enc Vitals Group     BP 11/10/22 1335 (!) 145/99     Pulse Rate 11/10/22 1335 71     Resp 11/10/22 1335 18     Temp 11/10/22 1335 98.8 F (37.1 C)     Temp Source 11/10/22 1335 Oral     SpO2 11/10/22 1335 99 %     Weight --      Height --      Head Circumference --      Peak Flow --      Pain Score 11/10/22 1334 0     Pain Loc --      Pain Edu? --      Excl. in GC? --    No data found.  Updated Vital Signs BP (!) 145/99   Pulse 71   Temp 98.8 F (37.1 C) (Oral)   Resp 18   SpO2 99%   Visual Acuity Right Eye Distance:   Left Eye Distance:   Bilateral Distance:    Right Eye Near:   Left Eye Near:    Bilateral Near:     Physical Exam Vitals and nursing note reviewed.  Constitutional:      General: She is not in acute distress.    Appearance: Normal appearance. She is well-developed. She is not ill-appearing.  HENT:     Mouth/Throat:     Mouth: Mucous membranes are moist.  Cardiovascular:     Rate and Rhythm: Normal rate and regular rhythm.     Heart sounds: Normal heart sounds.  Pulmonary:     Effort: Pulmonary effort is normal. No respiratory distress.     Breath sounds: Normal breath  sounds.  Musculoskeletal:     Cervical back:  Neck supple.     Right lower leg: No edema.     Left lower leg: No edema.  Skin:    General: Skin is warm and dry.  Neurological:     General: No focal deficit present.     Mental Status: She is alert and oriented to person, place, and time.     Gait: Gait normal.  Psychiatric:        Mood and Affect: Mood normal.        Behavior: Behavior normal.      UC Treatments / Results  Labs (all labs ordered are listed, but only abnormal results are displayed) Labs Reviewed - No data to display  EKG   Radiology No results found.  Procedures Procedures (including critical care time)  Medications Ordered in UC Medications - No data to display  Initial Impression / Assessment and Plan / UC Course  I have reviewed the triage vital signs and the nursing notes.  Pertinent labs & imaging results that were available during my care of the patient were reviewed by me and considered in my medical decision making (see chart for details).    Elevated blood pressure with HTN, medication refill.  30 day supply of triamterene-HCTZ provided.  Discussed with patient that her blood pressure is elevated today and needs to be rechecked by her new PCP in 2 to 4 weeks.  Education provided on managing hypertension.  Patient agrees to plan of care.   Final Clinical Impressions(s) / UC Diagnoses   Final diagnoses:  Elevated blood pressure reading in office with diagnosis of hypertension  Encounter for medication refill     Discharge Instructions      Your blood pressure is elevated today at 145/99.  Please have this rechecked by your primary care provider in 2-4 weeks.      Please follow up with your new primary care provider for additional refills of your blood pressure medication.       ED Prescriptions     Medication Sig Dispense Auth. Provider   triamterene-hydrochlorothiazide (MAXZIDE-25) 37.5-25 MG tablet Take 1 tablet by mouth daily.  30 tablet Sharion Balloon, NP      PDMP not reviewed this encounter.   Sharion Balloon, NP 11/10/22 1357

## 2022-11-10 NOTE — ED Notes (Signed)
Primary care provider appointment established for patient with Amy Frye at [facility], 11/12/22 at 11am.

## 2022-11-10 NOTE — ED Triage Notes (Signed)
Patient to Urgent Care for medication refill.  Has been without her triamterene-hydrochlorothiazide 37.5-25mg  tablet for 5 days.   Last filled 10/02/22.

## 2022-11-12 ENCOUNTER — Ambulatory Visit: Payer: Medicaid Other | Admitting: Nurse Practitioner

## 2022-11-12 ENCOUNTER — Encounter: Payer: Self-pay | Admitting: Nurse Practitioner

## 2022-11-12 VITALS — BP 122/88 | HR 94 | Ht 68.0 in | Wt 188.0 lb

## 2022-11-12 DIAGNOSIS — I7300 Raynaud's syndrome without gangrene: Secondary | ICD-10-CM | POA: Diagnosis not present

## 2022-11-12 DIAGNOSIS — F900 Attention-deficit hyperactivity disorder, predominantly inattentive type: Secondary | ICD-10-CM | POA: Diagnosis not present

## 2022-11-12 DIAGNOSIS — E611 Iron deficiency: Secondary | ICD-10-CM | POA: Insufficient documentation

## 2022-11-12 DIAGNOSIS — I10 Essential (primary) hypertension: Secondary | ICD-10-CM | POA: Insufficient documentation

## 2022-11-12 DIAGNOSIS — Z79899 Other long term (current) drug therapy: Secondary | ICD-10-CM

## 2022-11-12 DIAGNOSIS — E663 Overweight: Secondary | ICD-10-CM

## 2022-11-12 DIAGNOSIS — Z720 Tobacco use: Secondary | ICD-10-CM

## 2022-11-12 LAB — IBC + FERRITIN
Ferritin: 70.9 ng/mL (ref 10.0–291.0)
Iron: 58 ug/dL (ref 42–145)
Saturation Ratios: 20.3 % (ref 20.0–50.0)
TIBC: 285.6 ug/dL (ref 250.0–450.0)
Transferrin: 204 mg/dL — ABNORMAL LOW (ref 212.0–360.0)

## 2022-11-12 LAB — COMPREHENSIVE METABOLIC PANEL
ALT: 18 U/L (ref 0–35)
AST: 27 U/L (ref 0–37)
Albumin: 4.4 g/dL (ref 3.5–5.2)
Alkaline Phosphatase: 61 U/L (ref 39–117)
BUN: 7 mg/dL (ref 6–23)
CO2: 29 mEq/L (ref 19–32)
Calcium: 9.6 mg/dL (ref 8.4–10.5)
Chloride: 102 mEq/L (ref 96–112)
Creatinine, Ser: 0.84 mg/dL (ref 0.40–1.20)
GFR: 86.9 mL/min (ref >60.00)
Glucose, Bld: 81 mg/dL (ref 70–99)
Potassium: 3.4 mEq/L — ABNORMAL LOW (ref 3.5–5.1)
Sodium: 141 mEq/L (ref 135–145)
Total Bilirubin: 0.3 mg/dL (ref 0.2–1.2)
Total Protein: 7 g/dL (ref 6.0–8.3)

## 2022-11-12 LAB — LIPID PANEL
Cholesterol: 162 mg/dL (ref 0–200)
HDL: 35.8 mg/dL — ABNORMAL LOW (ref >39.00)
NonHDL: 125.86
Total CHOL/HDL Ratio: 5
Triglycerides: 213 mg/dL — ABNORMAL HIGH (ref 0.0–149.0)
VLDL: 42.6 mg/dL — ABNORMAL HIGH (ref 0.0–40.0)

## 2022-11-12 LAB — CBC
HCT: 44.4 % (ref 36.0–46.0)
Hemoglobin: 15.4 g/dL — ABNORMAL HIGH (ref 12.0–15.0)
MCHC: 34.6 g/dL (ref 30.0–36.0)
MCV: 95.2 fl (ref 78.0–100.0)
Platelets: 207 10*3/uL (ref 150.0–400.0)
RBC: 4.66 Mil/uL (ref 3.87–5.11)
RDW: 13.3 % (ref 11.5–15.5)
WBC: 14.6 10*3/uL — ABNORMAL HIGH (ref 4.0–10.5)

## 2022-11-12 LAB — TSH: TSH: 2.56 u[IU]/mL (ref 0.35–5.50)

## 2022-11-12 LAB — LDL CHOLESTEROL, DIRECT: Direct LDL: 97 mg/dL

## 2022-11-12 LAB — HEMOGLOBIN A1C: Hgb A1c MFr Bld: 5.4 % (ref 4.6–6.5)

## 2022-11-12 NOTE — Assessment & Plan Note (Signed)
Pending labs today. 

## 2022-11-12 NOTE — Assessment & Plan Note (Signed)
Pending labs today inclusive of A1c and TSH

## 2022-11-12 NOTE — Assessment & Plan Note (Signed)
Apparent on exam to the lower extremities.  Patient has good cap refill.  No gangrene or sores before

## 2022-11-12 NOTE — Patient Instructions (Signed)
Nice to see you today I will be in touch with the labs once I have them Follow up with me in 3 months, sooner if you need me 

## 2022-11-12 NOTE — Progress Notes (Signed)
New Patient Office Visit  Subjective    Patient ID: Amy Frye, female    DOB: 04/29/1981  Age: 42 y.o. MRN: [id number]  CC:  Chief Complaint  Patient presents with   Establish Care    HPI Amy Frye presents to establish care  HTN: curretnly maintained maxide and is doing well. Does not check blood pressure at home. States that when it is high headaches and lower leg swelling. Pot[place]iu that she is out of.    Iron deficiency: Patient is currently on over-the-counter iron supplementation daily.  She has no trouble with this.  Unsure if she had anemia or iron deficiency   ADHD: states that she will do 30mg  Xr in the morning . 20mg  in the afternoon. States that it does help. States that it does not cure it. States that she was diagnosised at age of 77. She did go to psych per patient report.  Did ask patient why she is on metformin.  Patient is unsure when she is on the medication.  She is unsure if she is ever been diagnosed prediabetic.  She was being seen for weight loss at 1 point unsure if metformin was used for that purpose    Tdap: unsure, she believes she got it approximately a year ago as she had a finger injury Covid: refused    Mammogram: Most recent abnormal.  They do want to do a biopsy with clip placement.  Patient is concerned because she does have breast implants.  We did have a detailed and long discussion in regards to implants versus clip or different parts of the breast that is highly important to know what the lump is in her breast as she does not know her biological parents family history  Pap smear: West side. States a few years since last pap. States that pcp would manage her birth control Outpatient Encounter Medications as of 11/12/2022  Medication Sig   albuterol (VENTOLIN HFA) 108 (90 Base) MCG/ACT inhaler INHALE 1-2 PUFFS BY MOUTH EVERY FOUR TO SIX HOURS AS NEEDED   amphetamine-dextroamphetamine (ADDERALL) 20 MG tablet Take 20 mg by  mouth daily.   ferrous sulfate 325 (65 FE) MG tablet Take by mouth.   INCASSIA 0.35 MG tablet Take 1 tablet by mouth daily.   loratadine (CLARITIN) 10 MG tablet Take 10 mg by mouth daily.   metFORMIN (GLUCOPHAGE) 500 MG tablet    triamterene-hydrochlorothiazide (MAXZIDE-25) 37.5-25 MG tablet Take 1 tablet by mouth daily.   amphetamine-dextroamphetamine (ADDERALL XR) 30 MG 24 hr capsule Take 30 mg by mouth daily. (Patient not taking: Reported on 11/12/2022)   KLOR-CON M10 10 MEQ tablet Take 10 mEq by mouth daily. (Patient not taking: Reported on 11/12/2022)   phentermine (ADIPEX-P) 37.5 MG tablet Take 37.5 mg by mouth daily before breakfast. (Patient not taking: Reported on 11/12/2022)   [DISCONTINUED] ABSORICA LD 24 MG CAPS Take 2 capsules by mouth daily.   [DISCONTINUED] ABSORICA LD 24 MG CAPS Take 2 capsules by mouth daily.   [DISCONTINUED] ACZONE 7.5 % GEL APPLY A THIN LAYER TO FACE IN THE MORNING, WASH OFF AT BEDTIME   [DISCONTINUED] ADDERALL XR 20 MG 24 hr capsule Take 20 mg by mouth daily.   [DISCONTINUED] amphetamine-dextroamphetamine (ADDERALL) 15 MG tablet TAKE 1 TABLET BY MOUTH IN AM   [DISCONTINUED] azithromycin (ZITHROMAX) 250 MG tablet Take first 2 tablets together, then 1 every day until finished.   [DISCONTINUED] budesonide-formoterol (SYMBICORT) 80-4.5 MCG/ACT inhaler Inhale into the lungs.   [  DISCONTINUED] ciclopirox (LOPROX) 0.77 % cream Apply  a small amount to affected area every night  to fingernail and cover with vaseline and bandaid   [DISCONTINUED] folic acid (FOLVITE) 1 MG tablet Take by mouth.   [DISCONTINUED] hydrocortisone 2.5 % cream Apply to affected areas at face 1-2 times daily as needed for rash up to 1 week.   [DISCONTINUED] Multiple Vitamin (MULTI-VITAMIN) tablet Take 1 tablet by mouth daily.   [DISCONTINUED] nicotine (NICODERM CQ - DOSED IN MG/24 HOURS) 14 mg/24hr patch Place 14 mg onto the skin daily.   [DISCONTINUED] Tiotropium Bromide Monohydrate (SPIRIVA  RESPIMAT) 1.25 MCG/ACT AERS INHALE 2 PUFFS BY MOUTH EVERY DAY   [DISCONTINUED] triamcinolone (KENALOG) 0.025 % ointment Apply  a small amount to affected area twice a day  Avoid face, groin, underarms   [DISCONTINUED] etonogestrel (NEXPLANON) implant 68 mg    No facility-administered encounter medications on file as of 11/12/2022.    Past Medical History:  Diagnosis Date   Heart murmur    Hypertension    Stroke [facility])     Past Surgical History:  Procedure Laterality Date   BREAST SURGERY  2017   Lift/ Enhancement bilateral   CESAREAN SECTION  04/10/2003    Family History  Adopted: Yes    Social History   Socioeconomic History   Marital status: Divorced    Spouse name: Not on file   Number of children: 1   Years of education: Not on file   Highest education level: Associate degree: academic program  Occupational History   Not on file  Tobacco Use   Smoking status: Every Day    Packs/day: 0.75    Years: 25.00    Total pack years: 18.75    Types: Cigarettes   Smokeless tobacco: Never  Vaping Use   Vaping Use: Never used  Substance and Sexual Activity   Alcohol use: Not Currently    Alcohol/week: 10.0 standard drinks of alcohol    Types: 10 Standard drinks or equivalent per week    Comment: occ   Drug use: Not Currently   Sexual activity: Not Currently    Partners: Male    Birth control/protection: I.U.D.    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  Respiratory:  Negative for shortness of  breath.   Cardiovascular:  Negative for chest pain and palpitations.  Gastrointestinal:  Negative for abdominal pain, constipation, diarrhea, nausea and vomiting.       BM daily   Genitourinary:  Negative for dysuria and hematuria.  Neurological:  Negative for headaches.  Psychiatric/Behavioral:  Negative for hallucinations and suicidal ideas. The patient has insomnia (entirely life.).         Objective    BP 122/88   Pulse 94   Ht 5\' 8"  (1.727 m)   Wt 188 lb (85.3 kg)   SpO2 95%   BMI 28.59 kg/m   Physical Exam Vitals and nursing note reviewed.  Constitutional:  Appearance: Normal appearance.  Cardiovascular:     Rate and Rhythm: Normal rate and regular rhythm.     Heart sounds: Normal heart sounds.  Pulmonary:     Effort: Pulmonary effort is normal.     Breath sounds: Normal breath sounds.  Neurological:     Mental Status: She is alert.     Deep Tendon Reflexes:     Reflex Scores:      Bicep reflexes are 2+ on the right side and 2+ on the left side.      Patellar reflexes are 2+ on the right side and 2+ on the left side.    Comments: Bilateral upper and lower extremity strength 5/5         Assessment & Plan:   Problem List Items Addressed This Visit       Cardiovascular and Mediastinum   Raynaud's disease - Primary    Apparent on exam to the lower extremities.  Patient has good cap refill.  No gangrene or sores before      Primary hypertension    Patient currently maintained on Maxide.  Blood pressure within normal limits.  Does not check blood pressure at home.  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  On potassium pending labs today      Relevant Orders   CBC   Hemoglobin A1c   Comprehensive metabolic panel   TSH   Lipid panel     Other   Attention deficit hyperactivity disorder (ADHD), predominantly inattentive type    Currently maintained on Adderall 30 XR and Adderall 20 IR as needed in the afternoons.  Controlled  substance agreement signed today pending UDS.  Will continue medication for patient      Relevant Orders   TSH   DRUG MONITORING, PANEL 8 WITH CONFIRMATION, URINE   Tobacco use   Iron deficiency    Pending labs today.      Relevant Orders   IBC + Ferritin   High risk medication use    Controlled substance agreement and UDS collected today      Relevant Orders   DRUG MONITORING, PANEL 8 WITH CONFIRMATION, URINE   Overweight    Pending labs today inclusive of A1c and TSH      Relevant Orders   Hemoglobin A1c   TSH   Lipid panel    Return in about 3 months (around 02/11/2023) for ADHD/medicatoin recheck .   Romilda Garret, NP

## 2022-11-12 NOTE — Assessment & Plan Note (Signed)
Currently maintained on Adderall 30 XR and Adderall 20 IR as needed in the afternoons.  Controlled substance agreement signed today pending UDS.  Will continue medication for patient

## 2022-11-12 NOTE — Assessment & Plan Note (Signed)
Patient currently maintained on Maxide.  Blood pressure within normal limits.  Does not check blood pressure at home.  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  On potassium pending labs today

## 2022-11-12 NOTE — Assessment & Plan Note (Signed)
Controlled substance agreement and UDS collected today

## 2022-11-14 ENCOUNTER — Other Ambulatory Visit: Payer: Self-pay | Admitting: Nurse Practitioner

## 2022-11-14 DIAGNOSIS — E876 Hypokalemia: Secondary | ICD-10-CM

## 2022-11-14 DIAGNOSIS — F900 Attention-deficit hyperactivity disorder, predominantly inattentive type: Secondary | ICD-10-CM

## 2022-11-14 MED ORDER — AMPHETAMINE-DEXTROAMPHET ER 30 MG PO CP24: 30.0000 mg | ORAL_CAPSULE | Freq: Every day | ORAL | 0 refills | Status: DC

## 2022-11-14 MED ORDER — KLOR-CON M10 10 MEQ PO TBCR: 10.0000 meq | EXTENDED_RELEASE_TABLET | Freq: Every day | ORAL | 1 refills | Status: DC

## 2022-11-14 MED ORDER — AMPHETAMINE-DEXTROAMPHETAMINE 20 MG PO TABS: 20.0000 mg | ORAL_TABLET | Freq: Every day | ORAL | 0 refills | Status: DC

## 2022-11-15 LAB — DRUG MONITORING, PANEL 8 WITH CONFIRMATION, URINE
6 Acetylmorphine: NEGATIVE ng/mL (ref <10)
Alcohol Metabolites: NEGATIVE ng/mL (ref <500)
Amphetamine: 1546 ng/mL — ABNORMAL HIGH (ref <250)
Amphetamines: POSITIVE ng/mL — AB (ref <500)
Benzodiazepines: NEGATIVE ng/mL (ref <100)
Buprenorphine, Urine: NEGATIVE ng/mL (ref <5)
Cocaine Metabolite: NEGATIVE ng/mL (ref <150)
Creatinine: 33.2 mg/dL (ref >=20.0)
MDMA: NEGATIVE ng/mL (ref <500)
Marijuana Metabolite: NEGATIVE ng/mL (ref <20)
Methamphetamine: NEGATIVE ng/mL (ref <250)
Opiates: NEGATIVE ng/mL (ref <100)
Oxidant: NEGATIVE ug/mL (ref <200)
Oxycodone: NEGATIVE ng/mL (ref <100)
pH: 7 (ref 4.5–9.0)

## 2022-11-15 LAB — DM TEMPLATE

## 2022-12-07 ENCOUNTER — Other Ambulatory Visit: Payer: Self-pay | Admitting: Nurse Practitioner

## 2022-12-07 DIAGNOSIS — R609 Edema, unspecified: Secondary | ICD-10-CM

## 2022-12-11 ENCOUNTER — Encounter: Payer: Self-pay | Admitting: Nurse Practitioner

## 2022-12-11 ENCOUNTER — Other Ambulatory Visit: Payer: Self-pay | Admitting: Nurse Practitioner

## 2022-12-11 DIAGNOSIS — Z1283 Encounter for screening for malignant neoplasm of skin: Secondary | ICD-10-CM

## 2022-12-11 DIAGNOSIS — F900 Attention-deficit hyperactivity disorder, predominantly inattentive type: Secondary | ICD-10-CM

## 2022-12-12 MED ORDER — AMPHETAMINE-DEXTROAMPHET ER 30 MG PO CP24: 30.0000 mg | ORAL_CAPSULE | Freq: Every day | ORAL | 0 refills | Status: DC

## 2022-12-12 MED ORDER — FLUTICASONE PROPIONATE 50 MCG/ACT NA SUSP: 2.0000 | Freq: Every day | NASAL | 0 refills | Status: DC

## 2022-12-12 MED ORDER — AMPHETAMINE-DEXTROAMPHETAMINE 20 MG PO TABS: 20.0000 mg | ORAL_TABLET | Freq: Every day | ORAL | 0 refills | Status: DC

## 2023-01-04 ENCOUNTER — Other Ambulatory Visit: Payer: Self-pay | Admitting: Nurse Practitioner

## 2023-01-09 ENCOUNTER — Ambulatory Visit: Payer: Medicaid Other | Admitting: Dermatology

## 2023-01-15 ENCOUNTER — Ambulatory Visit: Payer: Medicaid Other | Admitting: Dermatology

## 2023-01-21 ENCOUNTER — Other Ambulatory Visit: Payer: Self-pay | Admitting: Nurse Practitioner

## 2023-01-21 DIAGNOSIS — F900 Attention-deficit hyperactivity disorder, predominantly inattentive type: Secondary | ICD-10-CM

## 2023-01-21 MED ORDER — AMPHETAMINE-DEXTROAMPHETAMINE 20 MG PO TABS: 20.0000 mg | ORAL_TABLET | Freq: Every day | ORAL | 0 refills | Status: DC

## 2023-01-21 MED ORDER — AMPHETAMINE-DEXTROAMPHET ER 30 MG PO CP24: 30.0000 mg | ORAL_CAPSULE | Freq: Every day | ORAL | 0 refills | Status: DC

## 2023-01-23 ENCOUNTER — Encounter: Payer: Self-pay | Admitting: Dermatology

## 2023-01-23 ENCOUNTER — Ambulatory Visit: Payer: Medicaid Other | Admitting: Dermatology

## 2023-01-23 DIAGNOSIS — D239 Other benign neoplasm of skin, unspecified: Secondary | ICD-10-CM

## 2023-01-23 DIAGNOSIS — L700 Acne vulgaris: Secondary | ICD-10-CM | POA: Diagnosis not present

## 2023-01-23 DIAGNOSIS — D485 Neoplasm of uncertain behavior of skin: Secondary | ICD-10-CM

## 2023-01-23 DIAGNOSIS — L821 Other seborrheic keratosis: Secondary | ICD-10-CM | POA: Diagnosis not present

## 2023-01-23 MED ORDER — CLINDAMYCIN PHOSPHATE 1 % EX SOLN: Freq: Every day | CUTANEOUS | 2 refills | Status: DC

## 2023-01-23 NOTE — Progress Notes (Signed)
   Follow-Up Visit   Subjective  Amy Frye is a 42 y.o. female who presents for the following: Acne Vulgaris  Previously did a course of accutane but has started to break out again, mainly at lower face, chin. Not using any topicals.   Check a spot at right forearm, has peeled and itches, one at left knee.  The following portions of the chart were reviewed this encounter and updated as appropriate: medications, allergies, medical history  Review of Systems:  No other skin or systemic complaints except as noted in HPI or Assessment and Plan.  Objective  Well appearing patient in no apparent distress; mood and affect are within normal limits.  Areas Examined: Face, chest and back  Relevant exam findings are noted in the Assessment and Plan. left medial cheek 0.4 cm pink and brown very thin papule       Assessment & Plan   Neoplasm of uncertain behavior of skin left medial cheek  Biopsy at follow up. Will plan 2 mm punch.  R/o pigmented BCC > Atypia   ACNE VULGARIS Exam: Rare excoriated papule at face, few small scars  Chronic and persistent condition with duration or expected duration over one year. Condition is symptomatic/ bothersome to patient. Not currently at goal.  Treatment Plan: Recommend [facility] hypochlorous spray daily Apply clindamycin 1 % gel twice daily to affected areas as needed.   DERMATOFIBROMA Exam: Firm pink/brown papulenodule with dimple sign. Treatment Plan: Benign-appearing.  Observation.  Call clinic for new or changing lesions.    SEBORRHEIC KERATOSIS - Stuck-on, waxy, tan-brown papules and/or plaques  - Benign-appearing - Discussed benign etiology and prognosis. - Observe - Call for any changes   Return in about 3 weeks (around 02/13/2023) for Biopsies, acne.  Anise Salvo, RMA, am acting as scribe for Darden Dates, MD .   Documentation: I have reviewed the above documentation for accuracy and completeness, and I  agree with the above.  Darden Dates, MD

## 2023-01-23 NOTE — Patient Instructions (Addendum)
Recommend [facility] Hypochlorous Spray (found in the wound care section). The [facility] Hypochlorous Spray can be sprayed on daily and left on. If you are using clindamycin solution or lotion or another topical antibiotic to treat acne, using a hypochlorous product may help lower the risk of antibiotic resistant bacteria.   Start clindamycin 1-2 times daily to spot treat affected areas.   Due to recent changes in healthcare laws, you may see results of your pathology and/or laboratory studies on MyChart before the doctors have had a chance to review them. We understand that in some cases there may be results that are confusing or concerning to you. Please understand that not all results are received at the same time and often the doctors may need to interpret multiple results in order to provide you with the best plan of care or course of treatment. Therefore, we ask that you please give [place] 2 business days to thoroughly review all your results before contacting the office for clarification. Should we see a critical lab result, you will be contacted sooner.   If You Need Anything After Your Visit  If you have any questions or concerns for your doctor, please call our main line at [phone] and press option 4 to reach your doctor's medical assistant. If no one answers, please leave a voicemail as directed and we will return your call as soon as possible. Messages left after 4 pm will be answered the following business day.   You may also send [place] a message via MyChart. We typically respond to MyChart messages within 1-2 business days.  For prescription refills, please ask your pharmacy to contact our office. Our fax number is [phone].  If you have an urgent issue when the clinic is closed that cannot wait until the next business day, you can page your doctor at the number below.    Please note that while we do our best to be available for urgent issues outside of office hours, we are not  available 24/7.   If you have an urgent issue and are unable to reach [place], you may choose to seek medical care at your doctor's office, retail clinic, urgent care center, or emergency room.  If you have a medical emergency, please immediately call 911 or go to the emergency department.  Pager Numbers  - Dr. Gwen Pounds: [phone]  - Dr. Neale Burly: [phone]  - Dr. Roseanne Reno: [phone]  In the event of inclement weather, please call our main line at [phone] for an update on the status of any delays or closures.  Dermatology Medication Tips: Please keep the boxes that topical medications come in in order to help keep track of the instructions about where and how to use these. Pharmacies typically print the medication instructions only on the boxes and not directly on the medication tubes.   If your medication is too expensive, please contact our office at [phone] option 4 or send [place] a message through MyChart.   We are unable to tell what your co-pay for medications will be in advance as this is different depending on your insurance coverage. However, we may be able to find a substitute medication at lower cost or fill out paperwork to get insurance to cover a needed medication.   If a prior authorization is required to get your medication covered by your insurance company, please allow [place] 1-2 business days to complete this process.  Drug prices often vary depending on where the prescription is filled and some pharmacies may  offer cheaper prices.  The website www.goodrx.com contains coupons for medications through different pharmacies. The prices here do not account for what the cost may be with help from insurance (it may be cheaper with your insurance), but the website can give you the price if you did not use any insurance.  - You can print the associated coupon and take it with your prescription to the pharmacy.  - You may also stop by our office during regular business hours  and pick up a GoodRx coupon card.  - If you need your prescription sent electronically to a different pharmacy, notify our office through [facility] MyChart or by phone at [phone] option 4.     Si Usted Necesita Algo Despus de Su Visita  Tambin puede enviarnos un mensaje a travs de Clinical cytogeneticistMyChart. Por lo general respondemos a los mensajes de MyChart en el transcurso de 1 a 2 das hbiles.  Para renovar recetas, por favor pida a su farmacia que se ponga en contacto con nuestra oficina. Annie SableNuestro nmero de fax es [place] [phone].  Si tiene un asunto urgente cuando la clnica est cerrada y que no puede esperar hasta el siguiente da hbil, puede llamar/localizar a su doctor(a) al nmero que aparece a continuacin.   Por favor, tenga en cuenta que aunque hacemos todo lo posible para estar disponibles para asuntos urgentes fuera del horario de Three Pointsoficina, no estamos disponibles las 24 horas del da, los 7 [address]  [address] de la [place].   Si tiene un problema urgente y no puede comunicarse con nosotros, puede optar por buscar atencin mdica  en el consultorio de su doctor(a), en una clnica privada, en un centro de atencin urgente o en una sala de emergencias.  Si tiene Engineer, drillinguna emergencia mdica, por favor llame inmediatamente al 911 o vaya a la sala de emergencias.  Nmeros de bper  - Dr. Gwen PoundsKowalski: [phone]  - Dra. Moye: [phone]  - Dra. Roseanne RenoStewart: [phone]  En caso de inclemencias del [place]iem[place], por favor llame a Lacy Duverneynuestra lnea principal al [phone] para una actualizacin sobre el DISHestado de cualquier retraso o cierre.  Consejos para la medicacin en dermatologa: Por favor, guarde las cajas en las que vienen los medicamentos de uso tpico para ayudarle a seguir las instrucciones sobre dnde y cmo usarlos. Las farmacias generalmente imprimen las instrucciones del medicamento slo en las cajas y no directamente en los tubos del [place]icamento.   Si su medicamento es muy caro, por favor, pngase  en contacto con Rolm Galanuestra oficina llamando al [phone] y presione la opcin 4 o envenos un mensaje a travs de Clinical cytogeneticistMyChart.   No podemos decirle cul ser su copago por los medicamentos por adelantado ya que esto es diferente dependiendo de la cobertura de su seguro. Sin embargo, es posible que podamos encontrar un medicamento sustituto a Audiological scientistmenor costo o llenar un formulario para que el seguro cubra el medicamento que se considera necesario.   Si se requiere una autorizacin previa para que su compaa de seguros [place]ubra su medicamento, por favor permtanos de 1 a 2 das hbiles para completar [address] proces[address].  Los precios de los medicamentos varan con frecuencia dependiendo del Environmental consultantlugar de dnde se surte la receta y alguna farmacias pueden ofrecer precios ms baratos.  El sitio web www.goodrx.com tiene cupones para medicamentos de Health and safety inspectordiferentes farmacias. Los precios aqu no tienen en cuenta lo que podra costar con la ayuda del seguro (puede ser ms barato con su seguro), pero el sitio web puede darle el precio si no Dance movement psychotherapistutiliz  ningn seguro.  - Puede imprimir el cupn correspondiente y llevarlo con su receta a la farmacia.  - Tambin puede pasar por nuestra oficina durante el horario de atencin regular y Education officer, museum una tarjeta de cupones de GoodRx.  - Si necesita que su receta se enve electrnicamente a una farmacia diferente, informe a nuestra oficina a travs de MyChart de [place] o por telfono llamando al [phone] y presione la opcin 4.

## 2023-02-11 ENCOUNTER — Ambulatory Visit: Payer: Medicaid Other | Admitting: Nurse Practitioner

## 2023-02-14 ENCOUNTER — Encounter: Payer: Self-pay | Admitting: Dermatology

## 2023-02-14 ENCOUNTER — Ambulatory Visit: Payer: Medicaid Other | Admitting: Dermatology

## 2023-02-14 DIAGNOSIS — D2239 Melanocytic nevi of other parts of face: Secondary | ICD-10-CM | POA: Diagnosis not present

## 2023-02-14 DIAGNOSIS — L905 Scar conditions and fibrosis of skin: Secondary | ICD-10-CM

## 2023-02-14 DIAGNOSIS — W908XXA Exposure to other nonionizing radiation, initial encounter: Secondary | ICD-10-CM

## 2023-02-14 DIAGNOSIS — L578 Other skin changes due to chronic exposure to nonionizing radiation: Secondary | ICD-10-CM | POA: Diagnosis not present

## 2023-02-14 DIAGNOSIS — D492 Neoplasm of unspecified behavior of bone, soft tissue, and skin: Secondary | ICD-10-CM

## 2023-02-14 DIAGNOSIS — L738 Other specified follicular disorders: Secondary | ICD-10-CM

## 2023-02-14 DIAGNOSIS — X32XXXA Exposure to sunlight, initial encounter: Secondary | ICD-10-CM

## 2023-02-14 NOTE — Progress Notes (Signed)
   Follow-Up Visit   Subjective  Amy Frye is a 42 y.o. female who presents for the following: punch biopsy at left cheek to r/o pigmented BCC > Atypia.   She also asks about spots at her ankle.  The following portions of the chart were reviewed this encounter and updated as appropriate: medications, allergies, medical history  Review of Systems:  No other skin or systemic complaints except as noted in HPI or Assessment and Plan.  Objective  Well appearing patient in no apparent distress; mood and affect are within normal limits.   A focused examination was performed of the following areas: Face, legs  Relevant exam findings are noted in the Assessment and Plan.  left medial cheek 0.4 cm pink and brown very thin papule     Assessment & Plan   PSEUDOFOLLICULITIS Exam: Perifollicular erythematous papules   Treatment Plan: Avoid plucking hair; use trimmer Cont clindamycin topical twice a day Continue hypochlorous acid spray daily to twice a day  Neoplasm of skin left medial cheek  Skin / nail biopsy Type of biopsy: punch   Informed consent: discussed and consent obtained   Timeout: patient name, date of birth, surgical site, and procedure verified   Procedure prep:  Patient was prepped and draped in usual sterile fashion Prep type:  Isopropyl alcohol Anesthesia: the lesion was anesthetized in a standard fashion   Anesthetic:  1% lidocaine w/ epinephrine 1-100,000 buffered w/ 8.4% NaHCO3 Punch size:  2 mm Suture size:  5-0 Suture type: nylon   Suture removal (days):  7 Hemostasis achieved with: suture   Outcome: patient tolerated procedure well   Post-procedure details: wound care instructions given   Additional details:  Petrolatum and a pressure dressing were applied  Specimen 1 - Surgical pathology Differential Diagnosis: R/o pigmented BCC > Atypia  Check Margins: No  0.4 cm pink and brown very thin papule   SCAR Exam: Dyspigmented smooth patches  at right medial ankle  Benign-appearing.  Observation.  Call clinic for new or changing lesions. Recommend daily broad spectrum sunscreen SPF 30+, reapply every 2 hours as needed.  Treatment: Recommend Serica moisturizing scar formula cream every night or [facility] brand or Mederma silicone scar sheet every night for the first year after a scar appears to help with scar remodeling if desired. Scars remodel on their own for a full year and will gradually improve in appearance over time.  ACTINIC DAMAGE - chronic, secondary to cumulative UV radiation exposure/sun exposure over time - diffuse scaly erythematous macules with underlying dyspigmentation - Recommend daily broad spectrum sunscreen SPF 30+ to sun-exposed areas, reapply every 2 hours as needed.  - Recommend staying in the shade or wearing long sleeves, sun glasses (UVA+UVB protection) and wide brim hats (4-inch brim around the entire circumference of the hat). - Call for new or changing lesions. - schedule for FBSE   Return in about 1 week (around 02/21/2023) for Suture Removal.  Anise Salvo, RMA, am acting as scribe for Darden Dates, MD .   Documentation: I have reviewed the above documentation for accuracy and completeness, and I agree with the above.  Darden Dates, MD

## 2023-02-14 NOTE — Patient Instructions (Addendum)
Wound Care Instructions  Cleanse wound gently with soap and water once a day then pat dry with clean gauze. Apply a thin coat of Petrolatum (petroleum jelly, "Vaseline") over the wound (unless you have an allergy to this). We recommend that you use a new, sterile tube of Vaseline. Do not pick or remove scabs. Do not remove the yellow or white "healing tissue" from the base of the wound.  Cover the wound with fresh, clean, nonstick gauze and secure with paper tape. You may use Band-Aids in place of gauze and tape if the wound is small enough, but would recommend trimming much of the tape off as there is often too much. Sometimes Band-Aids can irritate the skin.  You should call the office for your biopsy report after 1 week if you have not already been contacted.  If you experience any problems, such as abnormal amounts of bleeding, swelling, significant bruising, significant pain, or evidence of infection, please call the office immediately.    Due to recent changes in healthcare laws, you may see results of your pathology and/or laboratory studies on MyChart before the doctors have had a chance to review them. We understand that in some cases there may be results that are confusing or concerning to you. Please understand that not all results are received at the same time and often the doctors may need to interpret multiple results in order to provide you with the best plan of care or course of treatment. Therefore, we ask that you please give us 2 business days to thoroughly review all your results before contacting the office for clarification. Should we see a critical lab result, you will be contacted sooner.   If You Need Anything After Your Visit  If you have any questions or concerns for your doctor, please call our main line at [phone] and press option 4 to reach your doctor's medical assistant. If no one answers, please leave a voicemail as directed and we will return your call as soon  as possible. Messages left after 4 pm will be answered the following business day.   You may also send us a message via MyChart. We typically respond to MyChart messages within 1-2 business days.  For prescription refills, please ask your pharmacy to contact our office. Our fax number is [phone].  If you have an urgent issue when the clinic is closed that cannot wait until the next business day, you can page your doctor at the number below.    Please note that while we do our best to be available for urgent issues outside of office hours, we are not available 24/7.   If you have an urgent issue and are unable to reach us, you may choose to seek medical care at your doctor's office, retail clinic, urgent care center, or emergency room.  If you have a medical emergency, please immediately call 911 or go to the emergency department.  Pager Numbers  - Dr. Kowalski: [phone]  - Dr. Moye: [phone]  - Dr. Stewart: [phone]  In the event of inclement weather, please call our main line at [phone] for an update on the status of any delays or closures.  Dermatology Medication Tips: Please keep the boxes that topical medications come in in order to help keep track of the instructions about where and how to use these. Pharmacies typically print the medication instructions only on the boxes and not directly on the medication tubes.   If your medication is too expensive, please   contact our office at [phone] option 4 or send us a message through MyChart.   We are unable to tell what your co-pay for medications will be in advance as this is different depending on your insurance coverage. However, we may be able to find a substitute medication at lower cost or fill out paperwork to get insurance to cover a needed medication.   If a prior authorization is required to get your medication covered by your insurance company, please allow us 1-2 business days to complete this  process.  Drug prices often vary depending on where the prescription is filled and some pharmacies may offer cheaper prices.  The website www.goodrx.com contains coupons for medications through different pharmacies. The prices here do not account for what the cost may be with help from insurance (it may be cheaper with your insurance), but the website can give you the price if you did not use any insurance.  - You can print the associated coupon and take it with your prescription to the pharmacy.  - You may also stop by our office during regular business hours and pick up a GoodRx coupon card.  - If you need your prescription sent electronically to a different pharmacy, notify our office through [facility] MyChart or by phone at [phone] option 4.     Si Usted Necesita Algo Despus de Su Visita  Tambin puede enviarnos un mensaje a travs de MyChart. Por lo general respondemos a los mensajes de MyChart en el transcurso de 1 a 2 das hbiles.  Para renovar recetas, por favor pida a su farmacia que se ponga en contacto con nuestra oficina. Nuestro nmero de fax es el [phone].  Si tiene un asunto urgente cuando la clnica est cerrada y que no puede esperar hasta el siguiente da hbil, puede llamar/localizar a su doctor(a) al nmero que aparece a continuacin.   Por favor, tenga en cuenta que aunque hacemos todo lo posible para estar disponibles para asuntos urgentes fuera del horario de oficina, no estamos disponibles las 24 horas del da, los 7 das de la semana.   Si tiene un problema urgente y no puede comunicarse con nosotros, puede optar por buscar atencin mdica  en el consultorio de su doctor(a), en una clnica privada, en un centro de atencin urgente o en una sala de emergencias.  Si tiene una emergencia mdica, por favor llame inmediatamente al 911 o vaya a la sala de emergencias.  Nmeros de bper  - Dr. Kowalski: [phone]  - Dra. Moye: [phone]  - Dra.  Stewart: [phone]  En caso de inclemencias del tiempo, por favor llame a nuestra lnea principal al [phone] para una actualizacin sobre el estado de cualquier retraso o cierre.  Consejos para la medicacin en dermatologa: Por favor, guarde las cajas en las que vienen los medicamentos de uso tpico para ayudarle a seguir las instrucciones sobre dnde y cmo usarlos. Las farmacias generalmente imprimen las instrucciones del medicamento slo en las cajas y no directamente en los tubos del medicamento.   Si su medicamento es muy caro, por favor, pngase en contacto con nuestra oficina llamando al [phone] y presione la opcin 4 o envenos un mensaje a travs de MyChart.   No podemos decirle cul ser su copago por los medicamentos por adelantado ya que esto es diferente dependiendo de la cobertura de su seguro. Sin embargo, es posible que podamos encontrar un medicamento sustituto a menor costo o llenar un formulario para que el seguro cubra el   medicamento que se considera necesario.   Si se requiere una autorizacin previa para que su compaa de seguros cubra su medicamento, por favor permtanos de 1 a 2 das hbiles para completar este proceso.  Los precios de los medicamentos varan con frecuencia dependiendo del lugar de dnde se surte la receta y alguna farmacias pueden ofrecer precios ms baratos.  El sitio web www.goodrx.com tiene cupones para medicamentos de diferentes farmacias. Los precios aqu no tienen en cuenta lo que podra costar con la ayuda del seguro (puede ser ms barato con su seguro), pero el sitio web puede darle el precio si no utiliz ningn seguro.  - Puede imprimir el cupn correspondiente y llevarlo con su receta a la farmacia.  - Tambin puede pasar por nuestra oficina durante el horario de atencin regular y recoger una tarjeta de cupones de GoodRx.  - Si necesita que su receta se enve electrnicamente a una farmacia diferente, informe a nuestra oficina a  travs de MyChart de [facility] o por telfono llamando al [phone] y presione la opcin 4.  

## 2023-02-18 ENCOUNTER — Ambulatory Visit: Payer: Medicaid Other | Admitting: Nurse Practitioner

## 2023-02-18 ENCOUNTER — Encounter: Payer: Self-pay | Admitting: Nurse Practitioner

## 2023-02-18 VITALS — BP 120/78 | HR 94 | Temp 98.8°F | Resp 16 | Ht 68.0 in | Wt 182.1 lb

## 2023-02-18 DIAGNOSIS — R87629 Unspecified abnormal cytological findings in specimens from vagina: Secondary | ICD-10-CM | POA: Diagnosis not present

## 2023-02-18 DIAGNOSIS — F900 Attention-deficit hyperactivity disorder, predominantly inattentive type: Secondary | ICD-10-CM

## 2023-02-18 MED ORDER — AMPHETAMINE-DEXTROAMPHETAMINE 20 MG PO TABS: 20.0000 mg | ORAL_TABLET | Freq: Every day | ORAL | 0 refills | Status: DC

## 2023-02-18 MED ORDER — AMPHETAMINE-DEXTROAMPHET ER 30 MG PO CP24: 30.0000 mg | ORAL_CAPSULE | Freq: Every day | ORAL | 0 refills | Status: DC

## 2023-02-18 MED ORDER — AMPHETAMINE-DEXTROAMPHET ER 30 MG PO CP24
30.0000 mg | ORAL_CAPSULE | Freq: Every day | ORAL | 0 refills | Status: DC
Start: 2023-02-18 — End: 2023-09-06

## 2023-02-18 NOTE — Progress Notes (Signed)
   Established Patient Office Visit  Subjective   Patient ID: Amy Frye, female    DOB: Aug 23, 1981  Age: 42 y.o. MRN: [id number]  Chief Complaint  Patient presents with   ADHD   Medication Refill      ADHD: Patient is currently maintained on Adderall 30 mg XR 1 tablet a day along with Adderall 20 mg IR.  Patient is here for follow-up.  States that it does as good as it every has.  States that she has not had any palpitations, insomnia, unintentional weight gain.   Review of Systems  Constitutional:  Negative for chills and fever.  Respiratory:  Negative for shortness of breath.   Cardiovascular:  Negative for chest pain and palpitations.  Neurological:  Negative for headaches.  Psychiatric/Behavioral:  The patient does not have insomnia.       Objective:     BP 120/78   Pulse 94   Temp 98.8 F (37.1 C)   Resp 16   Ht 5\' 8"  (1.727 m)   Wt 182 lb 2 oz (82.6 kg)   SpO2 99%   BMI 27.69 kg/m  BP Readings from Last 3 Encounters:  02/18/23 120/78  11/12/22 122/88  11/10/22 (!) 145/99   Wt Readings from Last 3 Encounters:  02/18/23 182 lb 2 oz (82.6 kg)  11/12/22 188 lb (85.3 kg)  12/12/21 175 lb (79.4 kg)      Physical Exam Vitals and nursing note reviewed.  Constitutional:      Appearance: Normal appearance.  Cardiovascular:     Rate and Rhythm: Normal rate and regular rhythm.     Heart sounds: Normal heart sounds.  Pulmonary:     Effort: Pulmonary effort is normal.     Breath sounds: Normal breath sounds.  Neurological:     Mental Status: She is alert.      No results found for any visits on 02/18/23.    The ASCVD Risk score (Arnett DK, et al., 2019) failed to calculate for the following reasons:   The patient has a prior MI or stroke diagnosis    Assessment & Plan:   Problem List Items Addressed This Visit       Other   Attention deficit hyperactivity disorder (ADHD), predominantly inattentive type    Patient currently maintained on  Adderall 30 mg XR along with Adderall 20 mg IR daily.  No red flags noted.  PDMP was reviewed urine drug screen up-to-date.  Continue medication as prescribed 3 months and then today.  Patient can follow-up in 6 months for medication recheck.      Relevant Medications   amphetamine-dextroamphetamine (ADDERALL XR) 30 MG 24 hr capsule   amphetamine-dextroamphetamine (ADDERALL) 20 MG tablet   amphetamine-dextroamphetamine (ADDERALL XR) 30 MG 24 hr capsule   amphetamine-dextroamphetamine (ADDERALL) 20 MG tablet   amphetamine-dextroamphetamine (ADDERALL XR) 30 MG 24 hr capsule   amphetamine-dextroamphetamine (ADDERALL) 20 MG tablet   Abnormal Pap smear of vagina - Primary    Patient on a low-dose birth control with age above 56 and smoking also with a history of abnormal Pap smear with what sounds to be like a LEEP procedure.  Ambulatory referral to GYN for further management of birth control and Pap smears.      Relevant Orders   Ambulatory referral to Gynecology    Return in about 6 months (around 08/21/2023) for CPE and Labs.    Audria Nine, NP

## 2023-02-18 NOTE — Assessment & Plan Note (Signed)
Patient currently maintained on Adderall 30 mg XR along with Adderall 20 mg IR daily.  No red flags noted.  PDMP was reviewed urine drug screen up-to-date.  Continue medication as prescribed 3 months and then today.  Patient can follow-up in 6 months for medication recheck.

## 2023-02-18 NOTE — Patient Instructions (Signed)
Nice to see you today STOP taking the metformin See me in 6 months for your physical and labs, sooner if you need me

## 2023-02-18 NOTE — Assessment & Plan Note (Signed)
Patient on a low-dose birth control with age above 70 and smoking also with a history of abnormal Pap smear with what sounds to be like a LEEP procedure.  Ambulatory referral to GYN for further management of birth control and Pap smears.

## 2023-02-21 ENCOUNTER — Ambulatory Visit (INDEPENDENT_AMBULATORY_CARE_PROVIDER_SITE_OTHER): Payer: Medicaid Other | Admitting: Dermatology

## 2023-02-21 DIAGNOSIS — Z4802 Encounter for removal of sutures: Secondary | ICD-10-CM

## 2023-02-21 NOTE — Progress Notes (Signed)
   Follow-Up Visit   Subjective  Amy Frye is a 42 y.o. female who presents for the following: Suture removal at left cheek  Pathology results not back yet.  The following portions of the chart were reviewed this encounter and updated as appropriate: medications, allergies, medical history  Review of Systems:  No other skin or systemic complaints except as noted in HPI or Assessment and Plan.  Objective  Well appearing patient in no apparent distress; mood and affect are within normal limits.  Areas Examined: face Relevant physical exam findings are noted in the Assessment and Plan.    Assessment & Plan    Encounter for Removal of Sutures - Incision site is clean, dry and intact. - Wound cleansed, sutures removed, wound cleansed and steri strips applied.  - Pathology results not back yet.  - Patient advised to keep steri-strips dry until they fall off. - Scars remodel for a full year. - Once steri-strips fall off, patient can apply over-the-counter silicone scar cream once to twice a day to help with scar remodeling if desired. - Patient advised to call with any concerns or if they notice any new or changing lesions.  Return for TBSE, as scheduled.  Anise Salvo, RMA, am acting as scribe for Darden Dates, MD .   Documentation: I have reviewed the above documentation for accuracy and completeness, and I agree with the above.  Darden Dates, MD

## 2023-02-21 NOTE — Patient Instructions (Addendum)
Recommend Serica moisturizing scar formula cream every night or [facility] brand or Mederma silicone scar sheet every night for the first year after a scar appears to help with scar remodeling if desired. Scars remodel on their own for a full year and will gradually improve in appearance over time.   Due to recent changes in healthcare laws, you may see results of your pathology and/or laboratory studies on MyChart before the doctors have had a chance to review them. We understand that in some cases there may be results that are confusing or concerning to you. Please understand that not all results are received at the same time and often the doctors may need to interpret multiple results in order to provide you with the best plan of care or course of treatment. Therefore, we ask that you please give us 2 business days to thoroughly review all your results before contacting the office for clarification. Should we see a critical lab result, you will be contacted sooner.   If You Need Anything After Your Visit  If you have any questions or concerns for your doctor, please call our main line at [phone] and press option 4 to reach your doctor's medical assistant. If no one answers, please leave a voicemail as directed and we will return your call as soon as possible. Messages left after 4 pm will be answered the following business day.   You may also send us a message via MyChart. We typically respond to MyChart messages within 1-2 business days.  For prescription refills, please ask your pharmacy to contact our office. Our fax number is [phone].  If you have an urgent issue when the clinic is closed that cannot wait until the next business day, you can page your doctor at the number below.    Please note that while we do our best to be available for urgent issues outside of office hours, we are not available 24/7.   If you have an urgent issue and are unable to reach us, you may choose to seek  medical care at your doctor's office, retail clinic, urgent care center, or emergency room.  If you have a medical emergency, please immediately call 911 or go to the emergency department.  Pager Numbers  - Dr. Kowalski: [phone]  - Dr. Moye: [phone]  - Dr. Stewart: [phone]  In the event of inclement weather, please call our main line at [phone] for an update on the status of any delays or closures.  Dermatology Medication Tips: Please keep the boxes that topical medications come in in order to help keep track of the instructions about where and how to use these. Pharmacies typically print the medication instructions only on the boxes and not directly on the medication tubes.   If your medication is too expensive, please contact our office at [phone] option 4 or send us a message through MyChart.   We are unable to tell what your co-pay for medications will be in advance as this is different depending on your insurance coverage. However, we may be able to find a substitute medication at lower cost or fill out paperwork to get insurance to cover a needed medication.   If a prior authorization is required to get your medication covered by your insurance company, please allow us 1-2 business days to complete this process.  Drug prices often vary depending on where the prescription is filled and some pharmacies may offer cheaper prices.  The website www.goodrx.com contains coupons for medications through   different pharmacies. The prices here do not account for what the cost may be with help from insurance (it may be cheaper with your insurance), but the website can give you the price if you did not use any insurance.  - You can print the associated coupon and take it with your prescription to the pharmacy.  - You may also stop by our office during regular business hours and pick up a GoodRx coupon card.  - If you need your prescription sent electronically to a  different pharmacy, notify our office through [facility] MyChart or by phone at [phone] option 4.     

## 2023-02-27 ENCOUNTER — Telehealth: Payer: Self-pay

## 2023-02-27 NOTE — Telephone Encounter (Signed)
Discussed pathology results. Patient voiced understanding.  

## 2023-02-27 NOTE — Telephone Encounter (Signed)
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 Thank you!

## 2023-03-07 ENCOUNTER — Encounter: Payer: Medicaid Other | Admitting: Dermatology

## 2023-03-21 ENCOUNTER — Other Ambulatory Visit: Payer: Self-pay | Admitting: Nurse Practitioner

## 2023-03-21 DIAGNOSIS — E876 Hypokalemia: Secondary | ICD-10-CM

## 2023-03-21 DIAGNOSIS — F900 Attention-deficit hyperactivity disorder, predominantly inattentive type: Secondary | ICD-10-CM

## 2023-03-21 NOTE — Telephone Encounter (Signed)
Needs to be refused call pt and let her know that she has refills at the pharmacy.

## 2023-03-22 NOTE — Telephone Encounter (Signed)
Noted  

## 2023-04-08 ENCOUNTER — Ambulatory Visit: Payer: Medicaid Other | Admitting: Obstetrics and Gynecology

## 2023-04-08 ENCOUNTER — Encounter: Payer: Self-pay | Admitting: Obstetrics and Gynecology

## 2023-04-08 ENCOUNTER — Other Ambulatory Visit (HOSPITAL_COMMUNITY)
Admission: RE | Admit: 2023-04-08 | Discharge: 2023-04-08 | Disposition: A | Discharge status: Home / Self Care | Payer: Medicaid Other | Source: Ambulatory Visit | Attending: Obstetrics and Gynecology | Admitting: Obstetrics and Gynecology

## 2023-04-08 VITALS — BP 128/90 | Ht 68.0 in | Wt 185.0 lb

## 2023-04-08 DIAGNOSIS — Z3041 Encounter for surveillance of contraceptive pills: Secondary | ICD-10-CM

## 2023-04-08 DIAGNOSIS — Z124 Encounter for screening for malignant neoplasm of cervix: Secondary | ICD-10-CM | POA: Diagnosis present

## 2023-04-08 DIAGNOSIS — Z1151 Encounter for screening for human papillomavirus (HPV): Secondary | ICD-10-CM | POA: Diagnosis present

## 2023-04-08 DIAGNOSIS — R928 Other abnormal and inconclusive findings on diagnostic imaging of breast: Secondary | ICD-10-CM | POA: Diagnosis not present

## 2023-04-08 MED ORDER — INCASSIA 0.35 MG PO TABS
1.0000 | ORAL_TABLET | Freq: Every day | ORAL | 3 refills | Status: DC
Start: 2023-04-08 — End: 2024-03-17

## 2023-04-08 NOTE — Progress Notes (Signed)
Amy Emms, NP   Chief Complaint  Patient presents with   Referral    [facility] and pap smear    HPI:      Ms. KAYREN HOLCK is a 42 y.o. G1P1 whose LMP was No LMP recorded. (Menstrual status: Oral contraceptives)., presents today for NP> 3 yrs ref for [facility] and pap. Pt on POPs. Otilio Carpen has BTB or bleeds if misses multiple pills. Not sexually active. Has mild dysmen with bleeding. Wants to continue POPs. Hx of CVA, tob use. Has done Mirena and nexplanon in past.  Neg pap/neg HPV DNA 10/24/18; hx of abn paps in past Pt with cat 4 mammo 10/23 for RT breast mass, increasing in size. Bx recommended but pt declined due to concern of marker. Has implants.   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   Smoker unmotivated to quit 04/20/2020   Attention deficit hyperactivity disorder (ADHD), predominantly inattentive type 04/20/2020    Past Surgical History:  Procedure Laterality Date   BREAST SURGERY  2017   Lift/ Enhancement bilateral   CESAREAN SECTION  04/10/2003    Family History  Adopted: Yes    Social History   Socioeconomic History   Marital status: Divorced    Spouse name: Not on file   Number of children: 1   Years of education: Not on file   Highest education level: Associate degree: academic program  Occupational History   Not on file  Tobacco Use   Smoking status: Every Day    Packs/day: 0.75    Years: 25.00    Additional pack years: 0.00    Total pack years: 18.75    Types: Cigarettes   Smokeless tobacco: Never  Vaping Use   Vaping Use: Never used  Substance and Sexual Activity   Alcohol use: Not Currently    Alcohol/week: 10.0 standard  drinks of alcohol    Types: 10 Standard drinks or equivalent per week   Drug use: Not Currently   Sexual activity: Not Currently    Partners: Male    Birth control/protection: Pill  Other Topics Concern   Not on file  Social History Narrative   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      Hobbies: Garden, Research scientist (life sciences) (19)   Social Determinants of Health   Financial Resource Strain: Not on file  Food Insecurity: Not on file  Transportation Needs: Not on file  Physical Activity: Not on file  Stress: Not on file  Social Connections: Not on file  Intimate Partner Violence: Not on file    Outpatient Medications Prior to Visit  Medication Sig Dispense Refill   albuterol (VENTOLIN HFA) 108 (90 Base) MCG/ACT inhaler INHALE 1-2 PUFFS BY MOUTH EVERY FOUR TO SIX HOURS AS NEEDED     amphetamine-dextroamphetamine (ADDERALL XR) 30 MG 24 hr capsule Take 1 capsule (30 mg total) by mouth daily. 30 capsule 0   amphetamine-dextroamphetamine (ADDERALL) 20 MG tablet Take 1 tablet (20 mg total) by mouth daily. 30 tablet 0   clindamycin (CLEOCIN T) 1 % external solution Apply topically daily. To affected areas to spot treat. 30 mL 2  ferrous sulfate 325 (65 FE) MG tablet Take by mouth.     KLOR-CON M10 10 MEQ tablet TAKE 1 TABLET BY MOUTH EVERY DAY 90 tablet 0   loratadine (CLARITIN) 10 MG tablet Take 10 mg by mouth daily.     triamterene-hydrochlorothiazide (MAXZIDE-25) 37.5-25 MG tablet TAKE 1 TABLET BY MOUTH EVERY DAY 90 tablet 1   INCASSIA 0.35 MG tablet Take 1 tablet by mouth daily.     amphetamine-dextroamphetamine (ADDERALL XR) 30 MG 24 hr capsule Take 1 capsule (30 mg total) by mouth daily. (Patient not taking: Reported on 04/08/2023) 30 capsule 0   amphetamine-dextroamphetamine (ADDERALL XR) 30 MG 24 hr capsule Take 1 capsule (30 mg total) by mouth daily. (Patient not taking: Reported on 04/08/2023) 30 capsule 0   amphetamine-dextroamphetamine (ADDERALL) 20 MG tablet Take 1  tablet (20 mg total) by mouth daily. (Patient not taking: Reported on 04/08/2023) 30 tablet 0   amphetamine-dextroamphetamine (ADDERALL) 20 MG tablet Take 1 tablet (20 mg total) by mouth daily. (Patient not taking: Reported on 04/08/2023) 30 tablet 0   fluticasone (FLONASE) 50 MCG/ACT nasal spray Place 2 sprays into both nostrils daily. 16 g 0   No facility-administered medications prior to visit.      ROS:  Review of Systems  Constitutional:  Negative for fever.  Gastrointestinal:  Negative for blood in stool, constipation, diarrhea, nausea and vomiting.  Genitourinary:  Negative for dyspareunia, dysuria, flank pain, frequency, hematuria, urgency, vaginal bleeding, vaginal discharge and vaginal pain.  Musculoskeletal:  Negative for back pain.  Skin:  Negative for rash.   BREAST: No symptoms   OBJECTIVE:   Vitals:  BP (!) 128/90   Ht 5\' 8"  (1.727 m)   Wt 185 lb (83.9 kg)   BMI 28.13 kg/m   Physical Exam Vitals reviewed.  Constitutional:      Appearance: She is well-developed.  Pulmonary:     Effort: Pulmonary effort is normal.  Genitourinary:    General: Normal vulva.     Pubic Area: No rash.      Labia:        Right: No rash, tenderness or lesion.        Left: No rash, tenderness or lesion.      Vagina: Normal. No vaginal discharge, erythema or tenderness.     Cervix: Normal.     Uterus: Normal. Not enlarged and not tender.      Adnexa: Right adnexa normal and left adnexa normal.       Right: No mass or tenderness.         Left: No mass or tenderness.    Musculoskeletal:        General: Normal range of motion.     Cervical back: Normal range of motion.  Skin:    General: Skin is warm and dry.  Neurological:     General: No focal deficit present.     Mental Status: She is alert and oriented to person, place, and time.  Psychiatric:        Mood and Affect: Mood normal.        Behavior: Behavior normal.        Thought Content: Thought content normal.         Judgment: Judgment normal.     Assessment/Plan: Encounter for surveillance of contraceptive pills - Plan: INCASSIA 0.35 MG tablet; Rx RF. Cont POPs. Condoms if more than 3 hrs late with pills.   Cervical cancer screening - Plan: Cytology - PAP  Screening for  HPV (human papillomavirus) - Plan: Cytology - PAP  Abnormal mammogram of right breast--encouraged pt to schedule bx. Discussed "marker" and rationale behind it.    Meds ordered this encounter  Medications   INCASSIA 0.35 MG tablet    Sig: Take 1 tablet (0.35 mg total) by mouth daily.    Dispense:  84 tablet    Refill:  3    Order Specific Question:   Supervising Provider    Answer:   Hildred Laser [[id number]]      Return if symptoms worsen or fail to improve.  Ramaj Frangos B. Mikaela Hilgeman, PA-C 04/08/2023 4:35 PM

## 2023-04-08 NOTE — Patient Instructions (Signed)
I value your feedback and you entrusting us with your care. If you get a [facility] patient survey, I would appreciate you taking the time to let us know about your experience today. Thank you! ? ? ?

## 2023-04-12 LAB — CYTOLOGY - PAP
Comment: NEGATIVE
Diagnosis: NEGATIVE
High risk HPV: NEGATIVE

## 2023-05-17 ENCOUNTER — Other Ambulatory Visit: Payer: Self-pay | Admitting: Nurse Practitioner

## 2023-05-17 DIAGNOSIS — R609 Edema, unspecified: Secondary | ICD-10-CM

## 2023-07-10 ENCOUNTER — Other Ambulatory Visit: Payer: Self-pay | Admitting: Nurse Practitioner

## 2023-07-10 DIAGNOSIS — F900 Attention-deficit hyperactivity disorder, predominantly inattentive type: Secondary | ICD-10-CM

## 2023-07-10 MED ORDER — AMPHETAMINE-DEXTROAMPHETAMINE 20 MG PO TABS: 20.0000 mg | ORAL_TABLET | Freq: Every day | ORAL | 0 refills | Status: DC

## 2023-07-10 MED ORDER — AMPHETAMINE-DEXTROAMPHET ER 30 MG PO CP24
30.0000 mg | ORAL_CAPSULE | Freq: Every day | ORAL | 0 refills | Status: DC
Start: 2023-07-10 — End: 2023-09-04

## 2023-08-19 ENCOUNTER — Other Ambulatory Visit: Payer: Self-pay | Admitting: Primary Care

## 2023-08-19 DIAGNOSIS — T502X5A Adverse effect of carbonic-anhydrase inhibitors, benzothiadiazides and other diuretics, initial encounter: Secondary | ICD-10-CM

## 2023-08-22 ENCOUNTER — Encounter: Payer: Medicaid Other | Admitting: Nurse Practitioner

## 2023-08-22 ENCOUNTER — Other Ambulatory Visit (INDEPENDENT_AMBULATORY_CARE_PROVIDER_SITE_OTHER): Payer: Medicaid Other | Admitting: Nurse Practitioner

## 2023-08-22 ENCOUNTER — Other Ambulatory Visit: Payer: Medicaid Other

## 2023-08-22 DIAGNOSIS — E663 Overweight: Secondary | ICD-10-CM

## 2023-08-22 DIAGNOSIS — E782 Mixed hyperlipidemia: Secondary | ICD-10-CM

## 2023-08-22 DIAGNOSIS — Z720 Tobacco use: Secondary | ICD-10-CM | POA: Diagnosis not present

## 2023-08-22 DIAGNOSIS — F900 Attention-deficit hyperactivity disorder, predominantly inattentive type: Secondary | ICD-10-CM

## 2023-08-22 DIAGNOSIS — I10 Essential (primary) hypertension: Secondary | ICD-10-CM | POA: Diagnosis not present

## 2023-08-22 DIAGNOSIS — E611 Iron deficiency: Secondary | ICD-10-CM

## 2023-08-22 LAB — CBC
HCT: 43.9 % (ref 36.0–46.0)
Hemoglobin: 15.2 g/dL — ABNORMAL HIGH (ref 12.0–15.0)
MCHC: 34.5 g/dL (ref 30.0–36.0)
MCV: 96 fL (ref 78.0–100.0)
Platelets: 202 10*3/uL (ref 150.0–400.0)
RBC: 4.57 Mil/uL (ref 3.87–5.11)
RDW: 13.2 % (ref 11.5–15.5)
WBC: 8.9 10*3/uL (ref 4.0–10.5)

## 2023-08-22 LAB — URINALYSIS, MICROSCOPIC ONLY: RBC / HPF: NONE SEEN (ref 0–?)

## 2023-08-22 LAB — COMPREHENSIVE METABOLIC PANEL
ALT: 13 U/L (ref 0–35)
AST: 19 U/L (ref 0–37)
Albumin: 4.2 g/dL (ref 3.5–5.2)
Alkaline Phosphatase: 72 U/L (ref 39–117)
BUN: 11 mg/dL (ref 6–23)
CO2: 30 meq/L (ref 19–32)
Calcium: 9.4 mg/dL (ref 8.4–10.5)
Chloride: 103 meq/L (ref 96–112)
Creatinine, Ser: 0.9 mg/dL (ref 0.40–1.20)
GFR: 79.56 mL/min (ref >60.00)
Glucose, Bld: 92 mg/dL (ref 70–99)
Potassium: 3.4 meq/L — ABNORMAL LOW (ref 3.5–5.1)
Sodium: 140 meq/L (ref 135–145)
Total Bilirubin: 0.5 mg/dL (ref 0.2–1.2)
Total Protein: 7 g/dL (ref 6.0–8.3)

## 2023-08-22 LAB — HEMOGLOBIN A1C: Hgb A1c MFr Bld: 5.5 % (ref 4.6–6.5)

## 2023-08-22 LAB — LIPID PANEL
Cholesterol: 149 mg/dL (ref 0–200)
HDL: 35.9 mg/dL — ABNORMAL LOW (ref >39.00)
LDL Cholesterol: 82 mg/dL (ref 0–99)
NonHDL: 113.05
Total CHOL/HDL Ratio: 4
Triglycerides: 157 mg/dL — ABNORMAL HIGH (ref 0.0–149.0)
VLDL: 31.4 mg/dL (ref 0.0–40.0)

## 2023-08-22 LAB — TSH: TSH: 2.49 u[IU]/mL (ref 0.35–5.50)

## 2023-09-02 ENCOUNTER — Ambulatory Visit (INDEPENDENT_AMBULATORY_CARE_PROVIDER_SITE_OTHER): Payer: Medicaid Other | Admitting: Nurse Practitioner

## 2023-09-02 ENCOUNTER — Encounter: Payer: Self-pay | Admitting: Nurse Practitioner

## 2023-09-02 VITALS — BP 132/88 | HR 102 | Temp 98.4°F | Ht 67.0 in | Wt 201.4 lb

## 2023-09-02 DIAGNOSIS — Z0000 Encounter for general adult medical examination without abnormal findings: Secondary | ICD-10-CM

## 2023-09-02 DIAGNOSIS — E669 Obesity, unspecified: Secondary | ICD-10-CM | POA: Insufficient documentation

## 2023-09-02 DIAGNOSIS — Z720 Tobacco use: Secondary | ICD-10-CM

## 2023-09-02 DIAGNOSIS — I10 Essential (primary) hypertension: Secondary | ICD-10-CM

## 2023-09-02 DIAGNOSIS — E611 Iron deficiency: Secondary | ICD-10-CM

## 2023-09-02 DIAGNOSIS — F900 Attention-deficit hyperactivity disorder, predominantly inattentive type: Secondary | ICD-10-CM

## 2023-09-02 NOTE — Assessment & Plan Note (Signed)
History of the same.  CBC within normal limits

## 2023-09-02 NOTE — Assessment & Plan Note (Signed)
Urine microscopy negative for microscopic hematuria encouraged smoking cessation

## 2023-09-02 NOTE — Patient Instructions (Signed)
Nice to see you today Follow up with me in 6 months, sooner if you need me

## 2023-09-02 NOTE — Assessment & Plan Note (Signed)
History of the same.  Patient currently maintained on Adderall 30 mg XR every morning and Adderall 20 mg IR every afternoon as needed.  Continue

## 2023-09-02 NOTE — Assessment & Plan Note (Signed)
Patient currently maintained on triamterene-hydrochlorothiazide.  Blood pressure under good control.  Continue medications prescribed

## 2023-09-02 NOTE — Progress Notes (Signed)
Established Patient Office Visit  Subjective   Patient ID: Amy Frye, female    DOB: 18-Feb-1981  Age: 42 y.o. MRN: [id number]  Chief Complaint  Patient presents with   Annual Exam    HPI   ADHD: currently on adderall 30mg  xr with as needed 20mg  IR in the afternoon.  Patient tolerates medication well no adverse drug events  HTN: patient currently on maxzide.  Does not check blood pressure at home tolerates medication well.  for complete physical and follow up of chronic conditions.  Immunizations: -Tetanus:  get at local pharmacy -Influenza: get at local pharmacy  -Shingles: too young  -Pneumonia: too young   Diet: Fair diet. She is eating 1-2 meals a day. States that she will snack. States that it is not the healthiest. Neita Carp  Exercise: No regular exercise. Yard work and around [employer] exam:  Suppose to wear glasses. Needs updating   Dental exam: Completes semi-annually    Colonoscopy: too young  Lung Cancer Screening: Does not qualify  Pap smear: followed by GYN. 04/08/2023 negative cells and negative HPV  Mammogram: Order placed for [place]   Sleep: will go to bed around 2-3am and get up around 7-8. Does not feel rested. States that she has always had trouble with sleeping.         Review of Systems  Constitutional:  Negative for chills and fever.  Respiratory:  Negative for shortness of breath.   Cardiovascular:  Negative for chest pain and leg swelling.  Gastrointestinal:  Negative for abdominal pain, blood in stool, constipation, diarrhea, nausea and vomiting.       BM daily   Genitourinary:  Negative for dysuria and hematuria.  Neurological:  Negative for tingling and headaches.  Psychiatric/Behavioral:  Negative for hallucinations and suicidal ideas.       Objective:     BP 132/88   Pulse (!) 102   Temp 98.4 F (36.9 C) (Oral)   Ht 5\' 7"  (1.702 m)   Wt 201 lb 6.4 oz (91.4 kg)   LMP 07/30/2023 (Approximate)   SpO2 95%   BMI  31.54 kg/m  BP Readings from Last 3 Encounters:  09/02/23 132/88  04/08/23 (!) 128/90  02/18/23 120/78   Wt Readings from Last 3 Encounters:  09/02/23 201 lb 6.4 oz (91.4 kg)  04/08/23 185 lb (83.9 kg)  02/18/23 182 lb 2 oz (82.6 kg)   SpO2 Readings from Last 3 Encounters:  09/02/23 95%  02/18/23 99%  11/12/22 95%      Physical Exam Vitals and nursing note reviewed.  Constitutional:      Appearance: Normal appearance.  HENT:     Right Ear: Tympanic membrane, ear canal and external ear normal.     Left Ear: Tympanic membrane, ear canal and external ear normal.     Mouth/Throat:     Mouth: Mucous membranes are moist.     Pharynx: Oropharynx is clear.  Eyes:     Extraocular Movements: Extraocular movements intact.     Pupils: Pupils are equal, round, and reactive to light.  Cardiovascular:     Rate and Rhythm: Normal rate and regular rhythm.     Pulses: Normal pulses.     Heart sounds: Normal heart sounds.  Pulmonary:     Effort: Pulmonary effort is normal.     Breath sounds: Normal breath sounds.  Abdominal:     General: Bowel sounds are normal. There is no distension.  Palpations: There is no mass.     Tenderness: There is no abdominal tenderness.     Hernia: No hernia is present.  Musculoskeletal:     Right lower leg: No edema.     Left lower leg: No edema.  Lymphadenopathy:     Cervical: No cervical adenopathy.  Skin:    General: Skin is warm.  Neurological:     General: No focal deficit present.     Mental Status: She is alert.     Deep Tendon Reflexes:     Reflex Scores:      Bicep reflexes are 2+ on the right side and 2+ on the left side.      Patellar reflexes are 2+ on the right side and 2+ on the left side.    Comments: Bilateral upper and lower extremity strength 5/5  Psychiatric:        Mood and Affect: Mood normal.        Behavior: Behavior normal.        Thought Content: Thought content normal.        Judgment: Judgment normal.      No  results found for any visits on 09/02/23.    The ASCVD Risk score (Arnett DK, et al., 2019) failed to calculate for the following reasons:   The patient has a prior MI or stroke diagnosis    Assessment & Plan:   Problem List Items Addressed This Visit       Cardiovascular and Mediastinum   Primary hypertension    Patient currently maintained on triamterene-hydrochlorothiazide.  Blood pressure under good control.  Continue medications prescribed        Other   Attention deficit hyperactivity disorder (ADHD), predominantly inattentive type    History of the same.  Patient currently maintained on Adderall 30 mg XR every morning and Adderall 20 mg IR every afternoon as needed.  Continue      Preventative health care - Primary    Discussed age-appropriate immunizations and screening exams.  Did review patient's personal, surgical, social, family histories.  Patient is up-to-date on all age-appropriate vaccinations she would like.  She can get her vaccines either at the health department or local pharmacy.  Patient is too young for CRC screening.  Order placed today for mammogram for breast cancer screening.  Patient given information to call and set up appointment.  Patient is up-to-date on cervical cancer screening is followed by GYN.  Patient was given information at discharge about preventative healthcare maintenance with anticipatory guidance      Tobacco use    Urine microscopy negative for microscopic hematuria encouraged smoking cessation      Iron deficiency    History of the same.  CBC within normal limits      Obesity (BMI 30-39.9)    Patient and she is gaining weight.  She is going to work on losing weight and healthy lifestyle modifications.       Return in about 6 months (around 03/01/2024) for ADD/HTN.    Audria Nine, NP

## 2023-09-02 NOTE — Assessment & Plan Note (Signed)
Patient and she is gaining weight.  She is going to work on losing weight and healthy lifestyle modifications.

## 2023-09-02 NOTE — Assessment & Plan Note (Signed)
Discussed age-appropriate immunizations and screening exams.  Did review patient's personal, surgical, social, family histories.  Patient is up-to-date on all age-appropriate vaccinations she would like.  She can get her vaccines either at the health department or local pharmacy.  Patient is too young for CRC screening.  Order placed today for mammogram for breast cancer screening.  Patient given information to call and set up appointment.  Patient is up-to-date on cervical cancer screening is followed by GYN.  Patient was given information at discharge about preventative healthcare maintenance with anticipatory guidance

## 2023-09-04 ENCOUNTER — Other Ambulatory Visit: Payer: Self-pay | Admitting: Nurse Practitioner

## 2023-09-04 DIAGNOSIS — F900 Attention-deficit hyperactivity disorder, predominantly inattentive type: Secondary | ICD-10-CM

## 2023-09-06 ENCOUNTER — Encounter: Payer: Self-pay | Admitting: Nurse Practitioner

## 2023-09-06 MED ORDER — AMPHETAMINE-DEXTROAMPHETAMINE 20 MG PO TABS
20.0000 mg | ORAL_TABLET | Freq: Every day | ORAL | 0 refills | Status: DC
Start: 2023-09-06 — End: 2024-03-17

## 2023-09-06 MED ORDER — AMPHETAMINE-DEXTROAMPHET ER 30 MG PO CP24: 30.0000 mg | ORAL_CAPSULE | Freq: Every day | ORAL | 0 refills | Status: DC

## 2023-09-06 MED ORDER — AMPHETAMINE-DEXTROAMPHETAMINE 20 MG PO TABS: 20.0000 mg | ORAL_TABLET | Freq: Every day | ORAL | 0 refills | Status: DC

## 2023-09-06 MED ORDER — AMPHETAMINE-DEXTROAMPHET ER 30 MG PO CP24
30.0000 mg | ORAL_CAPSULE | Freq: Every day | ORAL | 0 refills | Status: DC
Start: 2023-09-06 — End: 2024-02-07

## 2023-09-18 ENCOUNTER — Telehealth: Payer: Medicaid Other | Admitting: Physician Assistant

## 2023-09-18 DIAGNOSIS — B9689 Other specified bacterial agents as the cause of diseases classified elsewhere: Secondary | ICD-10-CM

## 2023-09-18 DIAGNOSIS — J019 Acute sinusitis, unspecified: Secondary | ICD-10-CM | POA: Diagnosis not present

## 2023-09-18 MED ORDER — FLUTICASONE PROPIONATE 50 MCG/ACT NA SUSP: 2.0000 | Freq: Every day | NASAL | 0 refills | Status: DC

## 2023-09-18 MED ORDER — DOXYCYCLINE HYCLATE 100 MG PO TABS
100.0000 mg | ORAL_TABLET | Freq: Two times a day (BID) | ORAL | 0 refills | Status: DC
Start: 2023-09-18 — End: 2024-03-17

## 2023-09-18 NOTE — Patient Instructions (Addendum)
Grier Rocher, thank you for joining Piedad Climes, PA-C for today's virtual visit.  While this provider is not your primary care provider (PCP), if your PCP is located in our provider database this encounter information will be shared with them immediately following your visit.   A [facility] MyChart account gives you access to today's visit and all your visits, tests, and labs performed at [facility] " click here if you don't have a  MyChart account or go to [email or web address]  Consent: (Patient) Amy Frye provided verbal consent for this virtual visit at the beginning of the encounter.  Current Medications:  Current Outpatient Medications:    doxycycline (VIBRA-TABS) 100 MG tablet, Take 1 tablet (100 mg total) by mouth 2 (two) times daily., Disp: 20 tablet, Rfl: 0   fluticasone (FLONASE) 50 MCG/ACT nasal spray, Place 2 sprays into both nostrils daily., Disp: 16 g, Rfl: 0   amphetamine-dextroamphetamine (ADDERALL XR) 30 MG 24 hr capsule, Take 1 capsule (30 mg total) by mouth daily., Disp: 30 capsule, Rfl: 0   amphetamine-dextroamphetamine (ADDERALL XR) 30 MG 24 hr capsule, Take 1 capsule (30 mg total) by mouth daily., Disp: 30 capsule, Rfl: 0   amphetamine-dextroamphetamine (ADDERALL XR) 30 MG 24 hr capsule, Take 1 capsule (30 mg total) by mouth daily., Disp: 30 capsule, Rfl: 0   amphetamine-dextroamphetamine (ADDERALL) 20 MG tablet, Take 1 tablet (20 mg total) by mouth daily., Disp: 30 tablet, Rfl: 0   amphetamine-dextroamphetamine (ADDERALL) 20 MG tablet, Take 1 tablet (20 mg total) by mouth daily., Disp: 30 tablet, Rfl: 0   amphetamine-dextroamphetamine (ADDERALL) 20 MG tablet, Take 1 tablet (20 mg total) by mouth daily., Disp: 30 tablet, Rfl: 0   ferrous sulfate 325 (65 FE) MG tablet, Take by mouth., Disp: , Rfl:    INCASSIA 0.35 MG tablet, Take 1 tablet (0.35 mg total) by mouth daily., Disp: 84 tablet, Rfl: 3   KLOR-CON M10 10 MEQ tablet, TAKE  1 TABLET BY MOUTH EVERY DAY, Disp: 90 tablet, Rfl: 0   loratadine (CLARITIN) 10 MG tablet, Take 10 mg by mouth daily., Disp: , Rfl:    triamterene-hydrochlorothiazide (MAXZIDE-25) 37.5-25 MG tablet, TAKE 1 TABLET BY MOUTH EVERY DAY, Disp: 90 tablet, Rfl: 1   Medications ordered in this encounter:  Meds ordered this encounter  Medications   doxycycline (VIBRA-TABS) 100 MG tablet    Sig: Take 1 tablet (100 mg total) by mouth 2 (two) times daily.    Dispense:  20 tablet    Refill:  0    Order Specific Question:   Supervising Provider    Answer:   Merrilee Jansky [[id number]]   fluticasone (FLONASE) 50 MCG/ACT nasal spray    Sig: Place 2 sprays into both nostrils daily.    Dispense:  16 g    Refill:  0    Order Specific Question:   Supervising Provider    Answer:   Merrilee Jansky [id number]     *If you need refills on other medications prior to your next appointment, please contact your pharmacy*  Follow-Up: Call back or seek an in-person evaluation if the symptoms worsen or if the condition fails to improve as anticipated.  [facility] [phone]  Other Instructions Please take antibiotic as directed.  Increase fluid intake.  Use Saline nasal spray.  Take a daily multivitamin. Use the Flonase daily as directed. Ok to continue your OTC medications.  Place a humidifier in the bedroom.  Please call  or return clinic if symptoms are not improving.  Sinusitis Sinusitis is redness, soreness, and swelling (inflammation) of the paranasal sinuses. Paranasal sinuses are air pockets within the bones of your face (beneath the eyes, the middle of the forehead, or above the eyes). In healthy paranasal sinuses, mucus is able to drain out, and air is able to circulate through them by way of your nose. However, when your paranasal sinuses are inflamed, mucus and air can become trapped. This can allow bacteria and other germs to grow and cause infection. Sinusitis can develop quickly and  last only a short time (acute) or continue over a long period (chronic). Sinusitis that lasts for more than 12 weeks is considered chronic.  CAUSES  Causes of sinusitis include: Allergies. Structural abnormalities, such as displacement of the cartilage that separates your nostrils (deviated septum), which can decrease the air flow through your nose and sinuses and affect sinus drainage. Functional abnormalities, such as when the small hairs (cilia) that line your sinuses and help remove mucus do not work properly or are not present. SYMPTOMS  Symptoms of acute and chronic sinusitis are the same. The primary symptoms are pain and pressure around the affected sinuses. Other symptoms include: Upper toothache. Earache. Headache. Bad breath. Decreased sense of smell and taste. A cough, which worsens when you are lying flat. Fatigue. Fever. Thick drainage from your nose, which often is green and may contain pus (purulent). Swelling and warmth over the affected sinuses. DIAGNOSIS  Your caregiver will perform a physical exam. During the exam, your caregiver may: Look in your nose for signs of abnormal growths in your nostrils (nasal polyps). Tap over the affected sinus to check for signs of infection. View the inside of your sinuses (endoscopy) with a special imaging device with a light attached (endoscope), which is inserted into your sinuses. If your caregiver suspects that you have chronic sinusitis, one or more of the following tests may be recommended: Allergy tests. Nasal culture A sample of mucus is taken from your nose and sent to a lab and screened for bacteria. Nasal cytology A sample of mucus is taken from your nose and examined by your caregiver to determine if your sinusitis is related to an allergy. TREATMENT  Most cases of acute sinusitis are related to a viral infection and will resolve on their own within 10 days. Sometimes medicines are prescribed to help relieve symptoms (pain  medicine, decongestants, nasal steroid sprays, or saline sprays).  However, for sinusitis related to a bacterial infection, your caregiver will prescribe antibiotic medicines. These are medicines that will help kill the bacteria causing the infection.  Rarely, sinusitis is caused by a fungal infection. In theses cases, your caregiver will prescribe antifungal medicine. For some cases of chronic sinusitis, surgery is needed. Generally, these are cases in which sinusitis recurs more than 3 times per year, despite other treatments. HOME CARE INSTRUCTIONS  Drink plenty of water. Water helps thin the mucus so your sinuses can drain more easily. Use a humidifier. Inhale steam 3 to 4 times a day (for example, sit in the bathroom with the shower running). Apply a warm, moist washcloth to your face 3 to 4 times a day, or as directed by your caregiver. Use saline nasal sprays to help moisten and clean your sinuses. Take over-the-counter or prescription medicines for pain, discomfort, or fever only as directed by your caregiver. SEEK IMMEDIATE MEDICAL CARE IF: You have increasing pain or severe headaches. You have nausea, vomiting, or drowsiness.  You have swelling around your face. You have vision problems. You have a stiff neck. You have difficulty breathing. MAKE SURE YOU:  Understand these instructions. Will watch your condition. Will get help right away if you are not doing well or get worse. Document Released: 10/01/2005 Document Revised: 12/24/2011 Document Reviewed: 10/16/2011 [facility] Patient Information 2014 [place], [place].    If you have been instructed to have an in-person evaluation today at a local Urgent Care facility, please use the link below. It will take you to a list of all of our available [facility], including address, phone number and hours of operation. Please do not delay care.  [facility]  If you or a family member do not have a primary care  provider, use the link below to schedule a visit and establish care. When you choose a [facility] primary care physician or advanced practice provider, you gain a long-term partner in health. Find a Primary Care Provider  Learn more about [facility]'s in-office and virtual care options: [facility] - Get Care Now

## 2023-09-18 NOTE — Progress Notes (Signed)
Virtual Visit Consent   Amy Frye, you are scheduled for a virtual visit with a [facility] provider today. Just as with appointments in the office, your consent must be obtained to participate. Your consent will be active for this visit and any virtual visit you may have with one of our providers in the next 365 days. If you have a MyChart account, a copy of this consent can be sent to you electronically.  As this is a virtual visit, video technology does not allow for your provider to perform a traditional examination. This may limit your provider's ability to fully assess your condition. If your provider identifies any concerns that need to be evaluated in person or the need to arrange testing (such as labs, EKG, etc.), we will make arrangements to do so. Although advances in technology are sophisticated, we cannot ensure that it will always work on either your end or our end. If the connection with a video visit is poor, the visit may have to be switched to a telephone visit. With either a video or telephone visit, we are not always able to ensure that we have a secure connection.  By engaging in this virtual visit, you consent to the provision of healthcare and authorize for your insurance to be billed (if applicable) for the services provided during this visit. Depending on your insurance coverage, you may receive a charge related to this service.  I need to obtain your verbal consent now. Are you willing to proceed with your visit today? Amy Frye has provided verbal consent on 09/18/2023 for a virtual visit (video or telephone). Piedad Climes, [place]  Date: 09/18/2023 2:42 PM  Virtual Visit via Video Note   I, Piedad Climes, connected with  Amy Frye  ([id number], [birth year]) on 09/18/23 at  2:30 PM EST by a video-enabled telemedicine application and verified that I am speaking with the correct person using two identifiers.  Location: Patient: Virtual Visit Location  Patient: Home Provider: Virtual Visit Location Provider: Home Office   I discussed the limitations of evaluation and management by telemedicine and the availability of in person appointments. The patient expressed understanding and agreed to proceed.    History of Present Illness: Amy Frye is a 42 y.o. who identifies as a female who was assigned female at birth, and is being seen today for possible sinusitis. Endorses symptoms starting 1.5 weeks ago with worsening head and nasal congestion, sinus pressure, facial pain and sinus pain.  Over past 2 days with low-grade fever and increased ear pressure and sinus pain.    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   Smoker unmotivated to quit 04/20/2020   Attention deficit hyperactivity disorder (ADHD), predominantly inattentive type 04/20/2020    Allergies:  Allergies  Allergen Reactions   Penicillins Other (See Comments)    unknown   Sulfa Antibiotics Swelling and Rash    Lip swelling   Medications:  Current Outpatient Medications:    doxycycline (VIBRA-TABS) 100 MG  tablet, Take 1 tablet (100 mg total) by mouth 2 (two) times daily., Disp: 20 tablet, Rfl: 0   fluticasone (FLONASE) 50 MCG/ACT nasal spray, Place 2 sprays into both nostrils daily., Disp: 16 g, Rfl: 0   amphetamine-dextroamphetamine (ADDERALL XR) 30 MG 24 hr capsule, Take 1 capsule (30 mg total) by mouth daily., Disp: 30 capsule, Rfl: 0   amphetamine-dextroamphetamine (ADDERALL XR) 30 MG 24 hr  capsule, Take 1 capsule (30 mg total) by mouth daily., Disp: 30 capsule, Rfl: 0   amphetamine-dextroamphetamine (ADDERALL XR) 30 MG 24 hr capsule, Take 1 capsule (30 mg total) by mouth daily., Disp: 30 capsule, Rfl: 0   amphetamine-dextroamphetamine (ADDERALL) 20 MG tablet, Take 1 tablet (20 mg total) by mouth daily., Disp: 30 tablet, Rfl: 0   amphetamine-dextroamphetamine (ADDERALL) 20 MG tablet, Take 1 tablet (20 mg total) by mouth daily., Disp: 30 tablet, Rfl: 0   amphetamine-dextroamphetamine (ADDERALL) 20 MG tablet, Take 1 tablet (20 mg total) by mouth daily., Disp: 30 tablet, Rfl: 0   ferrous sulfate 325 (65 FE) MG tablet, Take by mouth., Disp: , Rfl:    INCASSIA 0.35 MG tablet, Take 1 tablet (0.35 mg total) by mouth daily., Disp: 84 tablet, Rfl: 3   KLOR-CON M10 10 MEQ tablet, TAKE 1 TABLET BY MOUTH EVERY DAY, Disp: 90 tablet, Rfl: 0   loratadine (CLARITIN) 10 MG tablet, Take 10 mg by mouth daily., Disp: , Rfl:    triamterene-hydrochlorothiazide (MAXZIDE-25) 37.5-25 MG tablet, TAKE 1 TABLET BY MOUTH EVERY DAY, Disp: 90 tablet, Rfl: 1  Observations/Objective: Patient is well-developed, well-nourished in no acute distress.  Resting comfortably at home.  Head is normocephalic, atraumatic.  No labored breathing. Speech is clear and coherent with logical content.  Patient is alert and oriented at baseline.   Assessment and Plan: 1. Acute bacterial sinusitis - doxycycline (VIBRA-TABS) 100 MG tablet; Take 1 tablet (100 mg total) by mouth 2 (two) times daily.  Dispense: 20 tablet; Refill: 0 - fluticasone (FLONASE) 50 MCG/ACT nasal spray; Place 2 sprays into both nostrils daily.  Dispense: 16 g; Refill: 0  Rx Doxycycline.  Increase fluids.  Rest.  Saline nasal spray.  Probiotic.  Mucinex as directed.  Humidifier in bedroom. Flonase per orders.  Call or return to clinic if symptoms are not improving.   Follow Up Instructions: I discussed the assessment and treatment plan with the patient.  The patient was provided an opportunity to ask questions and all were answered. The patient agreed with the plan and demonstrated an understanding of the instructions.  A copy of instructions were sent to the patient via MyChart unless otherwise noted below.   The patient was advised to call back or seek an in-person evaluation if the symptoms worsen or if the condition fails to improve as anticipated.    Piedad Climes, PA-C

## 2023-10-08 ENCOUNTER — Telehealth: Payer: Medicaid Other | Admitting: Physician Assistant

## 2023-10-08 DIAGNOSIS — J019 Acute sinusitis, unspecified: Secondary | ICD-10-CM | POA: Diagnosis not present

## 2023-10-08 DIAGNOSIS — B9689 Other specified bacterial agents as the cause of diseases classified elsewhere: Secondary | ICD-10-CM | POA: Diagnosis not present

## 2023-10-08 MED ORDER — AZITHROMYCIN 250 MG PO TABS
ORAL_TABLET | ORAL | 0 refills | Status: AC
Start: 2023-10-08 — End: 2023-10-13

## 2023-10-08 NOTE — Progress Notes (Signed)
Virtual Visit Consent   Amy Frye, you are scheduled for a virtual visit with a [facility] provider today. Just as with appointments in the office, your consent must be obtained to participate. Your consent will be active for this visit and any virtual visit you may have with one of our providers in the next 365 days. If you have a MyChart account, a copy of this consent can be sent to you electronically.  As this is a virtual visit, video technology does not allow for your provider to perform a traditional examination. This may limit your provider's ability to fully assess your condition. If your provider identifies any concerns that need to be evaluated in person or the need to arrange testing (such as labs, EKG, etc.), we will make arrangements to do so. Although advances in technology are sophisticated, we cannot ensure that it will always work on either your end or our end. If the connection with a video visit is poor, the visit may have to be switched to a telephone visit. With either a video or telephone visit, we are not always able to ensure that we have a secure connection.  By engaging in this virtual visit, you consent to the provision of healthcare and authorize for your insurance to be billed (if applicable) for the services provided during this visit. Depending on your insurance coverage, you may receive a charge related to this service.  I need to obtain your verbal consent now. Are you willing to proceed with your visit today? Amy Frye has provided verbal consent on 10/08/2023 for a virtual visit (video or telephone). Tylene Fantasia Ward, PA-C  Date: 10/08/2023 7:25 PM  Virtual Visit via Video Note   I, Tylene Fantasia Ward, connected with  Amy Frye  ([id number], 09/26/1981) on 10/08/23 at  7:15 PM EST by a video-enabled telemedicine application and verified that I am speaking with the correct person using two identifiers.  Location: Patient: Virtual Visit Location Patient:  Home Provider: Virtual Visit Location Provider: Home Office   I discussed the limitations of evaluation and management by telemedicine and the availability of in person appointments. The patient expressed understanding and agreed to proceed.    History of Present Illness: Amy Frye is a 42 y.o. who identifies as a female who was assigned female at birth, and is being seen today for sinus pressure/pain, congestion.  She was treated for a sinus infection earlier this month.  She reports some improvement for a few days after completing doxycycline.  Sx returned about one week ago.  She denies fever, chills, body aches. 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   Smoker unmotivated to quit 04/20/2020   Attention deficit hyperactivity disorder (ADHD), predominantly inattentive type 04/20/2020    Allergies:  Allergies  Allergen Reactions   Penicillins Other (See Comments)    unknown   Sulfa Antibiotics Swelling and Rash    Lip swelling   Medications:  Current Outpatient Medications:    azithromycin (ZITHROMAX) 250 MG tablet, Take 2 tablets on day 1,  then 1 tablet daily on days 2 through 5, Disp: 6 tablet, Rfl: 0   amphetamine-dextroamphetamine (ADDERALL XR) 30 MG 24 hr capsule, Take 1 capsule (30 mg total) by mouth daily., Disp: 30 capsule, Rfl: 0   amphetamine-dextroamphetamine (ADDERALL XR) 30 MG 24 hr capsule, Take 1 capsule (30 mg total) by mouth daily., Disp: 30 capsule, Rfl: 0   amphetamine-dextroamphetamine (ADDERALL XR) 30 MG 24 hr capsule, Take 1 capsule (30  mg total) by mouth daily., Disp: 30 capsule, Rfl: 0   amphetamine-dextroamphetamine (ADDERALL) 20 MG tablet, Take 1 tablet (20 mg total) by mouth daily., Disp: 30 tablet, Rfl: 0   amphetamine-dextroamphetamine (ADDERALL) 20 MG tablet, Take 1 tablet (20 mg total) by mouth daily., Disp: 30 tablet, Rfl: 0   amphetamine-dextroamphetamine (ADDERALL) 20 MG tablet, Take 1 tablet (20 mg total) by mouth daily., Disp: 30 tablet, Rfl: 0   doxycycline (VIBRA-TABS) 100 MG tablet, Take 1 tablet (100 mg total) by mouth 2 (two) times daily., Disp: 20 tablet, Rfl: 0   ferrous sulfate 325 (65 FE) MG tablet, Take by mouth., Disp: , Rfl:    fluticasone (FLONASE) 50 MCG/ACT nasal spray, Place 2 sprays into both nostrils daily., Disp: 16 g, Rfl: 0   INCASSIA 0.35 MG tablet, Take 1 tablet (0.35 mg total) by mouth daily., Disp: 84 tablet, Rfl: 3   KLOR-CON M10 10 MEQ tablet, TAKE 1 TABLET BY MOUTH EVERY DAY, Disp: 90 tablet, Rfl: 0   loratadine (CLARITIN) 10 MG tablet, Take 10 mg by mouth daily., Disp: , Rfl:    triamterene-hydrochlorothiazide (MAXZIDE-25) 37.5-25 MG tablet, TAKE 1 TABLET BY MOUTH EVERY DAY, Disp: 90 tablet, Rfl: 1  Observations/Objective: Patient is well-developed, well-nourished in no acute distress.  Resting comfortably at home.  Head is normocephalic, atraumatic.  No labored breathing.  Speech is clear and coherent with logical content.  Patient is alert and oriented at baseline.    Assessment and Plan: 1. Acute bacterial sinusitis (Primary)  Recurrent sx, will send in antibiotic.  Supportive care discussed.  In person evaluation precautions discussed.   Follow Up Instructions: I discussed the assessment and treatment plan with the patient. The patient was provided an opportunity to ask questions and all were answered. The patient agreed with the plan and demonstrated an understanding of the instructions.  A copy of instructions were sent to the patient via MyChart unless otherwise noted  below.     The patient was advised to call back or seek an in-person evaluation if the symptoms worsen or if the condition fails to improve as anticipated.    Tylene Fantasia Ward, PA-C

## 2023-10-08 NOTE — Patient Instructions (Signed)
Grier Rocher, thank you for joining Tylene Fantasia Ward, PA-C for today's virtual visit.  While this provider is not your primary care provider (PCP), if your PCP is located in our provider database this encounter information will be shared with them immediately following your visit.   A [facility] MyChart account gives you access to today's visit and all your visits, tests, and labs performed at [facility] " click here if you don't have a [facility] MyChart account or go to [email or web address]  Consent: (Patient) BENTLIE DORAZIO provided verbal consent for this virtual visit at the beginning of the encounter.  Current Medications:  Current Outpatient Medications:    azithromycin (ZITHROMAX) 250 MG tablet, Take 2 tablets on day 1, then 1 tablet daily on days 2 through 5, Disp: 6 tablet, Rfl: 0   amphetamine-dextroamphetamine (ADDERALL XR) 30 MG 24 hr capsule, Take 1 capsule (30 mg total) by mouth daily., Disp: 30 capsule, Rfl: 0   amphetamine-dextroamphetamine (ADDERALL XR) 30 MG 24 hr capsule, Take 1 capsule (30 mg total) by mouth daily., Disp: 30 capsule, Rfl: 0   amphetamine-dextroamphetamine (ADDERALL XR) 30 MG 24 hr capsule, Take 1 capsule (30 mg total) by mouth daily., Disp: 30 capsule, Rfl: 0   amphetamine-dextroamphetamine (ADDERALL) 20 MG tablet, Take 1 tablet (20 mg total) by mouth daily., Disp: 30 tablet, Rfl: 0   amphetamine-dextroamphetamine (ADDERALL) 20 MG tablet, Take 1 tablet (20 mg total) by mouth daily., Disp: 30 tablet, Rfl: 0   amphetamine-dextroamphetamine (ADDERALL) 20 MG tablet, Take 1 tablet (20 mg total) by mouth daily., Disp: 30 tablet, Rfl: 0   doxycycline (VIBRA-TABS) 100 MG tablet, Take 1 tablet (100 mg total) by mouth 2 (two) times daily., Disp: 20 tablet, Rfl: 0   ferrous sulfate 325 (65 FE) MG tablet, Take by mouth., Disp: , Rfl:    fluticasone (FLONASE) 50 MCG/ACT nasal spray, Place 2 sprays into both nostrils daily., Disp: 16 g, Rfl: 0    INCASSIA 0.35 MG tablet, Take 1 tablet (0.35 mg total) by mouth daily., Disp: 84 tablet, Rfl: 3   KLOR-CON M10 10 MEQ tablet, TAKE 1 TABLET BY MOUTH EVERY DAY, Disp: 90 tablet, Rfl: 0   loratadine (CLARITIN) 10 MG tablet, Take 10 mg by mouth daily., Disp: , Rfl:    triamterene-hydrochlorothiazide (MAXZIDE-25) 37.5-25 MG tablet, TAKE 1 TABLET BY MOUTH EVERY DAY, Disp: 90 tablet, Rfl: 1   Medications ordered in this encounter:  Meds ordered this encounter  Medications   azithromycin (ZITHROMAX) 250 MG tablet    Sig: Take 2 tablets on day 1, then 1 tablet daily on days 2 through 5    Dispense:  6 tablet    Refill:  0    Supervising Provider:   Merrilee Jansky [phone]     *If you need refills on other medications prior to your next appointment, please contact your pharmacy*  Follow-Up: Call back or seek an in-person evaluation if the symptoms worsen or if the condition fails to improve as anticipated.  [facility] [phone]  Other Instructions Take antibiotic as prescribed.  Recommend Flonase, Mucinex, and Delsym for cough.  Drink plenty of fluids, rest.  If no improvement or symptoms become worse follow up for evaluation in person with your Primary Care Physician or Urgent Care.     If you have been instructed to have an in-person evaluation today at a local Urgent Care facility, please use the link below. It will take you to a  list of all of our available [facility], including address, phone number and hours of operation. Please do not delay care.  [facility]  If you or a family member do not have a primary care provider, use the link below to schedule a visit and establish care. When you choose a [facility] primary care physician or advanced practice provider, you gain a long-term partner in health. Find a Primary Care Provider  Learn more about [facility]'s in-office and virtual care options: [facility] - Get Care Now

## 2023-10-23 ENCOUNTER — Other Ambulatory Visit: Payer: Self-pay | Admitting: Nurse Practitioner

## 2023-10-23 DIAGNOSIS — E876 Hypokalemia: Secondary | ICD-10-CM

## 2023-10-23 DIAGNOSIS — R609 Edema, unspecified: Secondary | ICD-10-CM

## 2023-10-23 DIAGNOSIS — T502X5A Adverse effect of carbonic-anhydrase inhibitors, benzothiadiazides and other diuretics, initial encounter: Secondary | ICD-10-CM

## 2024-02-07 ENCOUNTER — Encounter: Payer: Self-pay | Admitting: Nurse Practitioner

## 2024-02-07 DIAGNOSIS — F900 Attention-deficit hyperactivity disorder, predominantly inattentive type: Secondary | ICD-10-CM

## 2024-02-07 MED ORDER — AMPHETAMINE-DEXTROAMPHETAMINE 20 MG PO TABS: 20.0000 mg | ORAL_TABLET | Freq: Every day | ORAL | 0 refills | Status: DC

## 2024-02-07 MED ORDER — AMPHETAMINE-DEXTROAMPHET ER 30 MG PO CP24: 30.0000 mg | ORAL_CAPSULE | Freq: Every day | ORAL | 0 refills | Status: DC

## 2024-02-12 IMAGING — MG MM DIGITAL DIAGNOSTIC UNILAT*R* W/ IMPLANTS
6 series · 6 of 14 positions shown · non-contrast
Comparison: Previous exam(s).

CLINICAL DATA: 40-year-old female presenting for six-month
follow-up of a probably benign right breast mass.

EXAM:
DIGITAL DIAGNOSITC UNILATERAL RIGHT MAMMOGRAM WITH IMPLANTS;
ULTRASOUND RIGHT BREAST LIMITED
TECHNIQUE: The images were evaluated with computer-aided detection.; Targeted
ultrasound examination of the right breast was performed

[R CC]
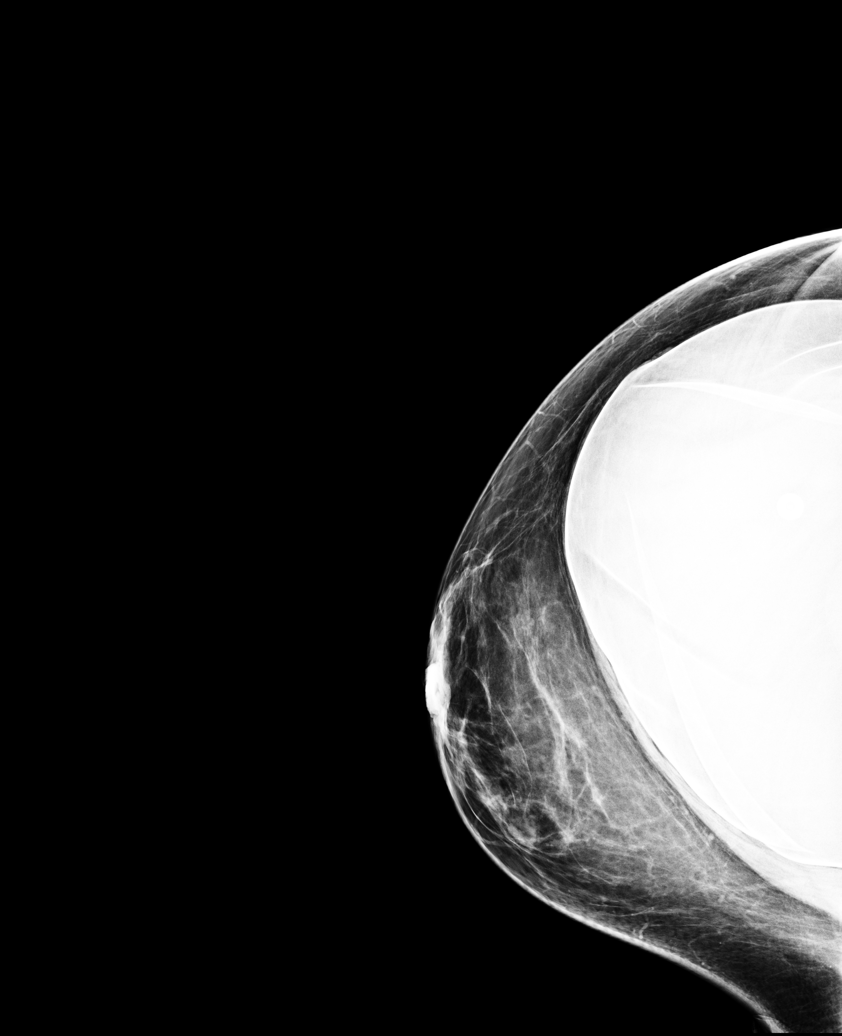

[R MLO]
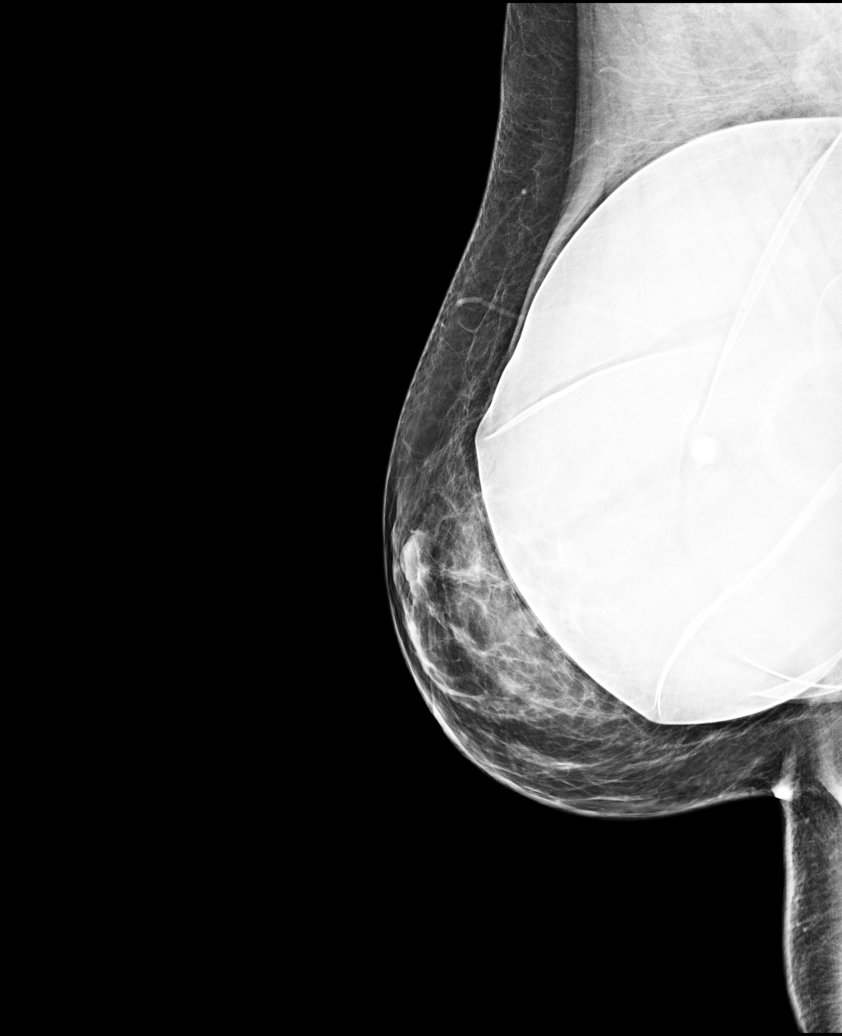

[R MLO synth-2D]
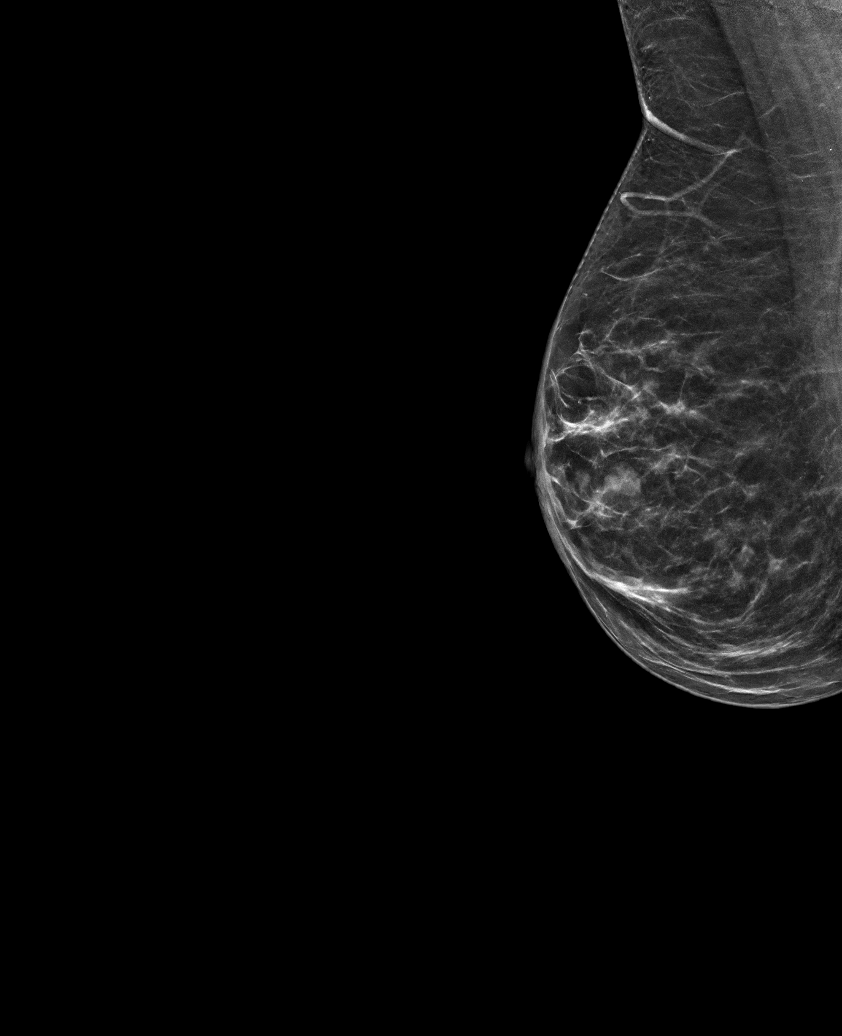

[R CC synth-2D]
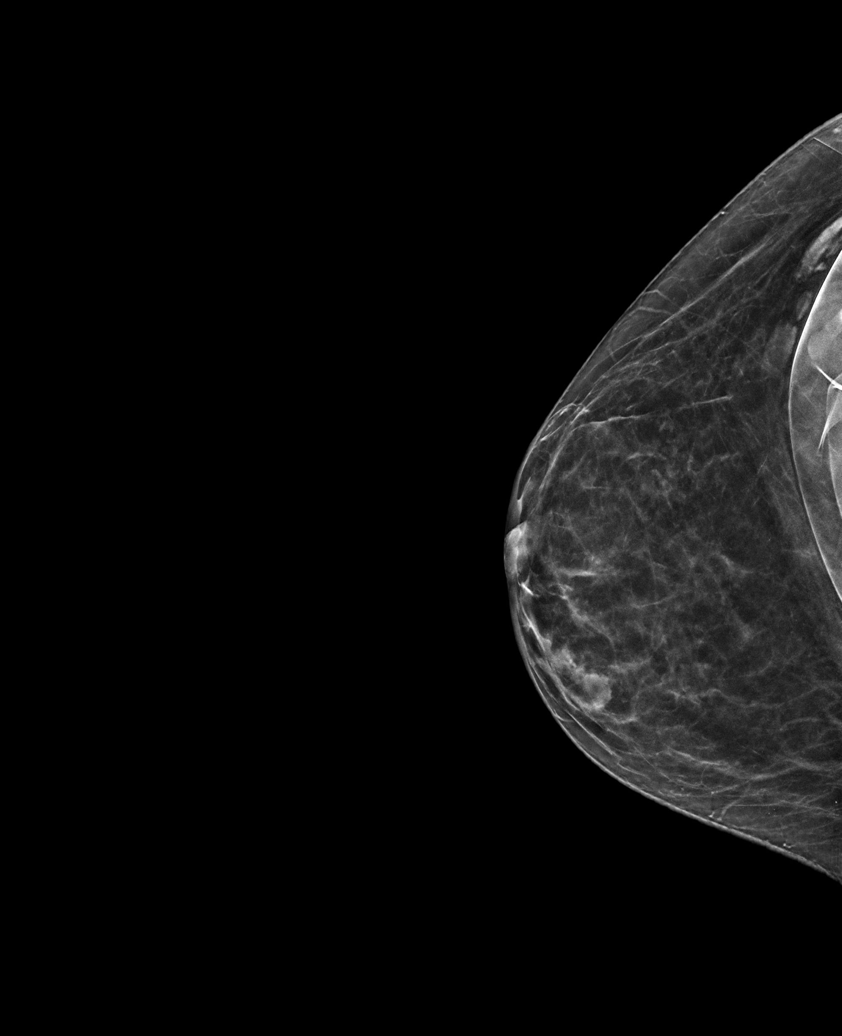

[R MLO tomo · tomo slice 31/62.0]
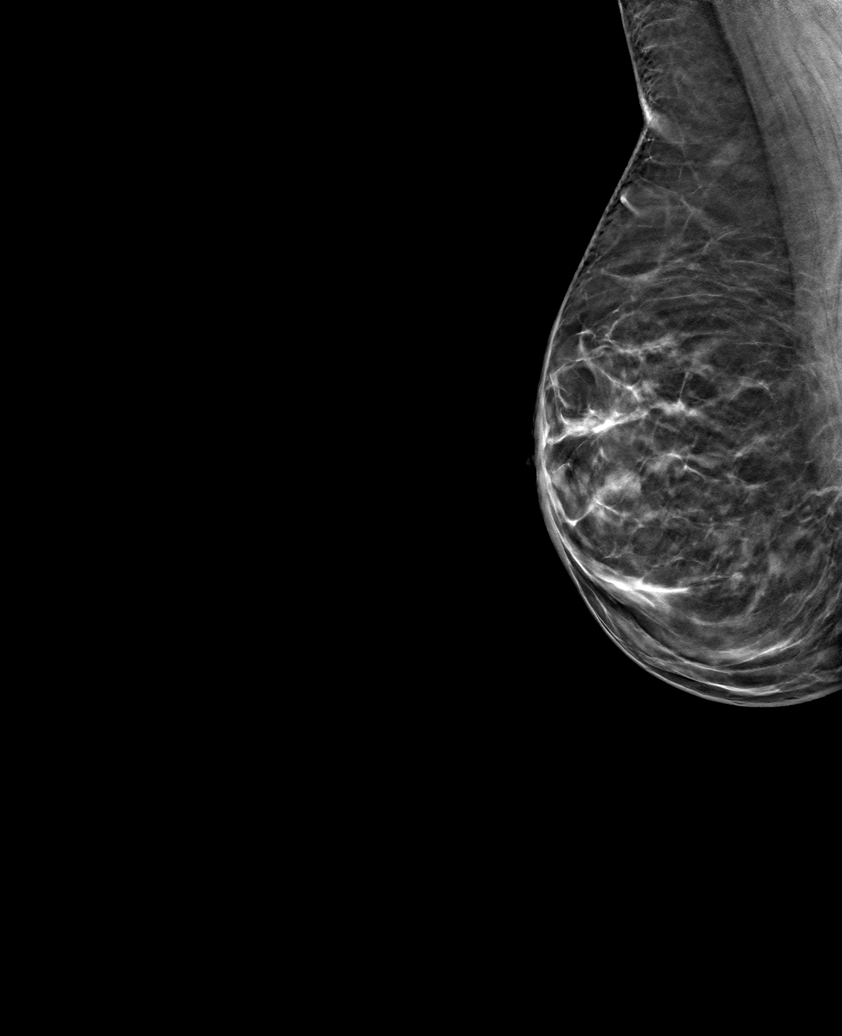

[R CC tomo · tomo slice 29/57.0]
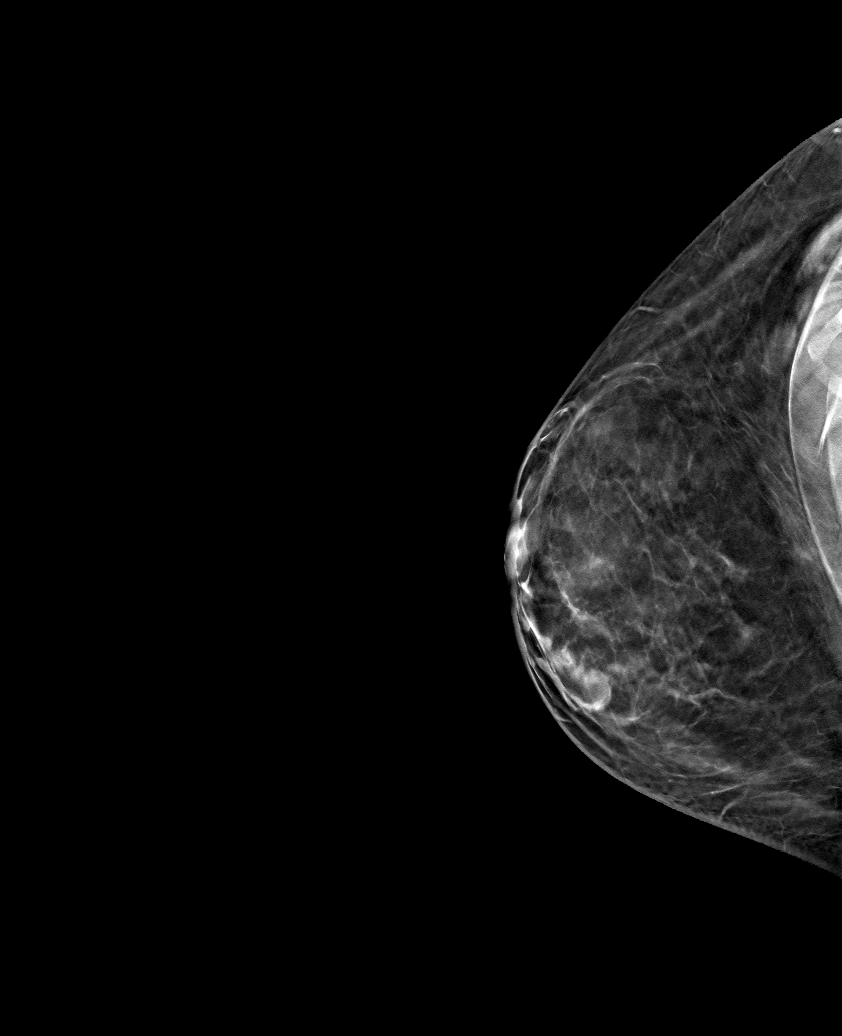

[6 of 14 positions shown; findings below may reference images not displayed]

ACR Breast Density Category b: There are scattered areas of
fibroglandular density.
FINDINGS: Mammogram:

There are stable grouped masses in the medial anterior aspect of the
right breast. There are no new suspicious findings elsewhere in the
right breast.

Ultrasound:

Targeted ultrasound is performed in the right breast at 3 o'clock
retroareolar demonstrating a cluster of cysts measuring 0.8 x 0.8 x
0.9 cm, previously measuring 0.8 x 0.6 x 0.7 cm.
IMPRESSION: Stable probably benign cluster of cysts in the medial retroareolar
right breast.

RECOMMENDATION:
Diagnostic bilateral mammogram and right breast ultrasound in 6
months.

I have discussed the findings and recommendations with the patient.
If applicable, a reminder letter will be sent to the patient
regarding the next appointment.

BI-RADS CATEGORY  3: Probably benign.

## 2024-03-02 ENCOUNTER — Ambulatory Visit: Payer: Medicaid Other | Admitting: Nurse Practitioner

## 2024-03-11 ENCOUNTER — Ambulatory Visit: Admitting: Nurse Practitioner

## 2024-03-11 NOTE — Progress Notes (Deleted)
   Established Patient Office Visit  Subjective   Patient ID: Amy Frye, female    DOB: 04-Feb-1981  Age: 43 y.o. MRN: [id number]  No chief complaint on file.   HPI  ADHD: patient is currently on adderall 30mg  XR and adderall 20mg  IR daily   {History (Optional):23778}  ROS    Objective:     There were no vitals taken for this visit. {Vitals History (Optional):23777}  Physical Exam   No results found for any visits on 03/11/24.  {Labs (Optional):23779}  The ASCVD Risk score (Arnett DK, et al., 2019) failed to calculate for the following reasons:   Risk score cannot be calculated because patient has a medical history suggesting prior/existing ASCVD    Assessment & Plan:   Problem List Items Addressed This Visit   None   No follow-ups on file.    Margarie Shay, NP

## 2024-03-17 ENCOUNTER — Ambulatory Visit: Admitting: Nurse Practitioner

## 2024-03-17 VITALS — BP 116/80 | HR 105 | Temp 98.6°F | Ht 67.0 in | Wt 202.4 lb

## 2024-03-17 DIAGNOSIS — R1013 Epigastric pain: Secondary | ICD-10-CM | POA: Insufficient documentation

## 2024-03-17 DIAGNOSIS — F900 Attention-deficit hyperactivity disorder, predominantly inattentive type: Secondary | ICD-10-CM

## 2024-03-17 DIAGNOSIS — K625 Hemorrhage of anus and rectum: Secondary | ICD-10-CM | POA: Diagnosis not present

## 2024-03-17 DIAGNOSIS — I10 Essential (primary) hypertension: Secondary | ICD-10-CM

## 2024-03-17 LAB — COMPREHENSIVE METABOLIC PANEL WITH GFR
ALT: 24 U/L (ref 0–35)
AST: 27 U/L (ref 0–37)
Albumin: 4.3 g/dL (ref 3.5–5.2)
Alkaline Phosphatase: 78 U/L (ref 39–117)
BUN: 15 mg/dL (ref 6–23)
CO2: 31 meq/L (ref 19–32)
Calcium: 9.6 mg/dL (ref 8.4–10.5)
Chloride: 100 meq/L (ref 96–112)
Creatinine, Ser: 0.87 mg/dL (ref 0.40–1.20)
GFR: 82.54 mL/min (ref >60.00)
Glucose, Bld: 91 mg/dL (ref 70–99)
Potassium: 3.2 meq/L — ABNORMAL LOW (ref 3.5–5.1)
Sodium: 140 meq/L (ref 135–145)
Total Bilirubin: 0.6 mg/dL (ref 0.2–1.2)
Total Protein: 7 g/dL (ref 6.0–8.3)

## 2024-03-17 LAB — CBC
HCT: 42.5 % (ref 36.0–46.0)
Hemoglobin: 14.5 g/dL (ref 12.0–15.0)
MCHC: 34.2 g/dL (ref 30.0–36.0)
MCV: 92.8 fl (ref 78.0–100.0)
Platelets: 200 10*3/uL (ref 150.0–400.0)
RBC: 4.58 Mil/uL (ref 3.87–5.11)
RDW: 13.7 % (ref 11.5–15.5)
WBC: 12.2 10*3/uL — ABNORMAL HIGH (ref 4.0–10.5)

## 2024-03-17 LAB — LIPASE: Lipase: 95 U/L — ABNORMAL HIGH (ref 11.0–59.0)

## 2024-03-17 MED ORDER — AMPHETAMINE-DEXTROAMPHETAMINE 20 MG PO TABS: 20.0000 mg | ORAL_TABLET | Freq: Two times a day (BID) | ORAL | 0 refills | Status: DC

## 2024-03-17 MED ORDER — OMEPRAZOLE 20 MG PO CPDR: 20.0000 mg | DELAYED_RELEASE_CAPSULE | Freq: Every day | ORAL | 1 refills | Status: DC

## 2024-03-17 NOTE — Assessment & Plan Note (Signed)
 Patient currently maintained on triamterene -hydrochlorothiazide.  Blood pressure well-controlled.  Continue medication as prescribed.

## 2024-03-17 NOTE — Assessment & Plan Note (Signed)
 Longstanding intermittent for a year.  Will have patient increase her fluid and fiber intake.  As she is on ferrous sulfate.  Offered rectal exam in office today patient politely declined.  Will do iFob a positive refer to GI.  Will check CBC today

## 2024-03-17 NOTE — Assessment & Plan Note (Signed)
 Patient states she has ulcers with a history of similar.  She does use NSAIDs sometimes.  Encouraged cessation of NSAID use.  Will do omeprazole 20 mg daily for 30 to 60 days.  Check basic blood work inclusive of CBC, CMP, lipase.

## 2024-03-17 NOTE — Progress Notes (Signed)
 Established Patient Office Visit  Subjective   Patient ID: LAJADA JANES, female    DOB: [birth year]  Age: 43 y.o. MRN: [id number]  Chief Complaint  Patient presents with   Follow-up    ADD/HTN. Pt complains she never thinks the medication ever does enough. States of seeing a slight difference. Pt notices the adderall 20 mg works way more than the XR.     Medication Refill    Adderall xr     HPI  ADHD: Patient currently maintained on Adderall 30 mg XR 1 capsule daily along with Adderall 20 mg IR daily Patient was diagnosed at age 13.  She was followed by psychiatry at that point.  States that the medication is beneficial and helps but does not completely remit her symptoms. States that she feels like the XR medicaiton does much of anything in the am> states that she may or aynot feel a difference if she takes. She did take a 20mg  this monring and yesterday morning and feels like it made a difference   HTN: patient is currently maintained on maxzide 37.5-25mg  daily. She does not hceck blood pressure at home.  States that she has stopped taking the birth control. States that she has been off the medicaoitn for approx 6 weeks. States that she would stop a week and have a period. She has not had a period since she stopped the medication   States that she is pooping blood for almost a year. States that it is bright red blood that is note daily. States that it is randomly and she does have a BM daily. States that it is once a week. She denies any hard stools. States that she hdoes have a burning in her stomach. States that she does not know if food makes a difference. She can wake up and have the sensatoin. She will get reflux when she bends with her gardening     Review of Systems  Constitutional:  Negative for chills and fever.  Respiratory:  Negative for shortness of breath.   Cardiovascular:  Negative for chest pain.  Gastrointestinal:  Positive for abdominal pain and blood in stool.  Negative for nausea and vomiting.  Neurological:  Positive for headaches.  Psychiatric/Behavioral:  Negative for hallucinations and suicidal ideas.       Objective:     BP 116/80   Pulse (!) 105   Temp 98.6 F (37 C) (Oral)   Ht 5\' 7"  (1.702 m)   Wt 202 lb 6.4 oz (91.8 kg)   SpO2 92%   BMI 31.70 kg/m    Physical Exam Vitals and nursing note reviewed.  Constitutional:      Appearance: Normal appearance.  Cardiovascular:     Rate and Rhythm: Normal rate and regular rhythm.     Heart sounds: Normal heart sounds.  Pulmonary:     Effort: Pulmonary effort is normal.     Breath sounds: Normal breath sounds.  Abdominal:     General: Bowel sounds are normal. There is no distension.     Palpations: There is no mass.     Tenderness: There is abdominal tenderness in the epigastric area.     Hernia: No hernia is present.  Neurological:     Mental Status: She is alert.      No results found for any visits on 03/17/24.    The ASCVD Risk score (Arnett DK, et al., 2019) failed to calculate for the following reasons:   Risk score cannot be  calculated because patient has a medical history suggesting prior/existing ASCVD    Assessment & Plan:   Problem List Items Addressed This Visit       Cardiovascular and Mediastinum   Primary hypertension   Patient currently maintained on triamterene -hydrochlorothiazide.  Blood pressure well-controlled.  Continue medication as prescribed.        Digestive   BRBPR (bright red blood per rectum)   Longstanding intermittent for a year.  Will have patient increase her fluid and fiber intake.  As she is on ferrous sulfate.  Offered rectal exam in office today patient politely declined.  Will do iFob a positive refer to GI.  Will check CBC today      Relevant Orders   Fecal Globin By Immunochemistry   CBC     Other   Attention deficit hyperactivity disorder (ADHD), predominantly inattentive type   Patient was maintained on Adderall 30  mg XR in the morning and Adderall 20 mg IR in the afternoon as needed.  States she feels like the XR is not effective.  Will switch her to Adderall 20 mg IR twice daily.  She will follow-up in 3 months.      Relevant Medications   amphetamine -dextroamphetamine  (ADDERALL) 20 MG tablet   amphetamine -dextroamphetamine  (ADDERALL) 20 MG tablet   amphetamine -dextroamphetamine  (ADDERALL) 20 MG tablet   Epigastric pain - Primary   Patient states she has ulcers with a history of similar.  She does use NSAIDs sometimes.  Encouraged cessation of NSAID use.  Will do omeprazole 20 mg daily for 30 to 60 days.  Check basic blood work inclusive of CBC, CMP, lipase.      Relevant Medications   omeprazole (PRILOSEC) 20 MG capsule   Other Relevant Orders   Comprehensive metabolic panel with GFR   Lipase    Return in about 3 months (around 06/17/2024) for ADD/HTN/BRBR.    Margarie Shay, NP

## 2024-03-17 NOTE — Assessment & Plan Note (Signed)
 Patient was maintained on Adderall 30 mg XR in the morning and Adderall 20 mg IR in the afternoon as needed.  States she feels like the XR is not effective.  Will switch her to Adderall 20 mg IR twice daily.  She will follow-up in 3 months.

## 2024-03-17 NOTE — Patient Instructions (Signed)
 Nice to see you today Try and get 64oz of water a day Use an over the counter fiber supplement Follow up with me in 3 months, sooner if you need me

## 2024-03-18 ENCOUNTER — Ambulatory Visit: Payer: Self-pay | Admitting: Nurse Practitioner

## 2024-03-18 DIAGNOSIS — E876 Hypokalemia: Secondary | ICD-10-CM

## 2024-03-18 MED ORDER — POTASSIUM CHLORIDE CRYS ER 20 MEQ PO TBCR: 40.0000 meq | EXTENDED_RELEASE_TABLET | Freq: Once | ORAL | 0 refills | Status: DC

## 2024-03-22 ENCOUNTER — Other Ambulatory Visit: Payer: Self-pay | Admitting: Obstetrics and Gynecology

## 2024-03-22 DIAGNOSIS — Z3041 Encounter for surveillance of contraceptive pills: Secondary | ICD-10-CM

## 2024-03-30 ENCOUNTER — Other Ambulatory Visit (INDEPENDENT_AMBULATORY_CARE_PROVIDER_SITE_OTHER): Payer: Self-pay

## 2024-03-30 DIAGNOSIS — K625 Hemorrhage of anus and rectum: Secondary | ICD-10-CM | POA: Diagnosis not present

## 2024-03-30 NOTE — Addendum Note (Signed)
 Addended by: Bernadene Brewer on: 03/30/2024 04:39 PM   Modules accepted: Orders

## 2024-04-01 LAB — FECAL OCCULT BLOOD, IMMUNOCHEMICAL: Fecal Occult Bld: NEGATIVE

## 2024-04-10 ENCOUNTER — Other Ambulatory Visit: Payer: Self-pay | Admitting: Nurse Practitioner

## 2024-04-10 DIAGNOSIS — R1013 Epigastric pain: Secondary | ICD-10-CM

## 2024-05-22 ENCOUNTER — Other Ambulatory Visit: Payer: Self-pay | Admitting: Nurse Practitioner

## 2024-05-22 DIAGNOSIS — E876 Hypokalemia: Secondary | ICD-10-CM

## 2024-05-28 ENCOUNTER — Other Ambulatory Visit: Payer: Self-pay | Admitting: Nurse Practitioner

## 2024-05-28 DIAGNOSIS — R609 Edema, unspecified: Secondary | ICD-10-CM

## 2024-06-18 ENCOUNTER — Ambulatory Visit: Admitting: Nurse Practitioner

## 2024-06-18 ENCOUNTER — Telehealth (INDEPENDENT_AMBULATORY_CARE_PROVIDER_SITE_OTHER): Admitting: Nurse Practitioner

## 2024-06-18 ENCOUNTER — Telehealth: Payer: Self-pay | Admitting: Nurse Practitioner

## 2024-06-18 DIAGNOSIS — E669 Obesity, unspecified: Secondary | ICD-10-CM

## 2024-06-18 DIAGNOSIS — F900 Attention-deficit hyperactivity disorder, predominantly inattentive type: Secondary | ICD-10-CM

## 2024-06-18 MED ORDER — AMPHETAMINE-DEXTROAMPHET ER 30 MG PO CP24: 30.0000 mg | ORAL_CAPSULE | ORAL | 0 refills | Status: DC

## 2024-06-18 MED ORDER — AMPHETAMINE-DEXTROAMPHETAMINE 20 MG PO TABS: 20.0000 mg | ORAL_TABLET | Freq: Every day | ORAL | 0 refills | Status: DC

## 2024-06-18 MED ORDER — WEGOVY 0.25 MG/0.5ML ~~LOC~~ SOAJ: 0.2500 mg | SUBCUTANEOUS | 0 refills | Status: DC

## 2024-06-18 NOTE — Telephone Encounter (Signed)
 Needs 3 month in office f/u with me for CPE. Any slot will do

## 2024-06-18 NOTE — Progress Notes (Signed)
 Ph: [phone] Fax: [phone]   Patient ID: Amy Frye, female    DOB: Jan 01, 1981, 43 y.o.   MRN: [id number]  Virtual visit completed through MyChart, a video enabled telemedicine application. Due to national recommendations of social distancing due to COVID-19, a virtual visit is felt to be most appropriate for this patient at this time. Reviewed limitations, risks, security and privacy concerns of performing a virtual visit and the availability of in person appointments. I also reviewed that there may be a patient responsible charge related to this service. The patient agreed to proceed.   Patient location: home Provider location: [place] at [facility], office Persons participating in this virtual visit: patient, provider   If any vitals were documented, they were collected by patient at home unless specified below.    There were no vitals taken for this visit.   CC: weight loss/ ADD Subjective:   HPI: Amy Frye is a 43 y.o. female presenting on 06/18/2024 for No chief complaint on file.    Discussed the use of AI scribe software for clinical note transcription with the patient, who gave verbal consent to proceed.  History of Present Illness Amy Frye is a 43 year old female with ADD/ADHD who presents for follow-up on her medication regimen.  She is currently taking Adderall 20 mg twice a day for ADD/ADHD. Previously, she used an extended-release formulation of 30 mg with a 20 mg booster as needed. She feels the same with both regimens of Adderall.  She experienced upper abdominal pain, which she believes was related to Excedrin use. She used Prilosec instead of omeprazole , and the abdominal pain has resolved since discontinuing Excedrin. She had a negative at-home stool test for blood and attributes previous bleeding to aspirin use, which she has since stopped.  She is concerned about weight gain and is seeking dietary advice. No current abdominal pain or  bleeding in stool.     Relevant past medical, surgical, family and social history reviewed and updated as indicated. Interim medical history since our last visit reviewed. Allergies and medications reviewed and updated. Outpatient Medications Prior to Visit  Medication Sig Dispense Refill   ferrous sulfate 325 (65 FE) MG tablet Take by mouth.     KLOR-CON  M10 10 MEQ tablet TAKE 1 TABLET BY MOUTH EVERY DAY 90 tablet 1   loratadine (CLARITIN) 10 MG tablet Take 10 mg by mouth daily.     omeprazole  (PRILOSEC) 20 MG capsule Take 1 capsule (20 mg total) by mouth daily. 30 capsule 1   potassium chloride  SA (KLOR-CON  M) 20 MEQ tablet Take 2 tablets (40 mEq total) by mouth once for 1 dose. 2 tablet 0   triamterene -hydrochlorothiazide (MAXZIDE-25) 37.5-25 MG tablet TAKE 1 TABLET BY MOUTH EVERY DAY 90 tablet 1   amphetamine -dextroamphetamine  (ADDERALL) 20 MG tablet Take 1 tablet (20 mg total) by mouth 2 (two) times daily. 60 tablet 0   amphetamine -dextroamphetamine  (ADDERALL) 20 MG tablet Take 1 tablet (20 mg total) by mouth 2 (two) times daily. 60 tablet 0   amphetamine -dextroamphetamine  (ADDERALL) 20 MG tablet Take 1 tablet (20 mg total) by mouth 2 (two) times daily. 60 tablet 0   No facility-administered medications prior to visit.     Per HPI unless specifically indicated in ROS section below Review of Systems Objective:  There were no vitals taken for this visit.  Wt Readings from Last 3 Encounters:  03/17/24 202 lb 6.4 oz (91.8 kg)  09/02/23 201 lb 6.4 oz (  91.4 kg)  04/08/23 185 lb (83.9 kg)       Physical exam: Gen: alert, NAD, not ill appearing Pulm: speaks in complete sentences without increased work of breathing Psych: normal mood, normal thought content      Results for orders placed or performed in visit on 03/30/24  Fecal occult blood, imunochemical   Collection Time: 03/30/24  4:40 PM   Specimen: Stool  Result Value Ref Range   Fecal Occult Bld Negative Negative    Assessment & Plan:   Attention deficit hyperactivity disorder (ADHD), predominantly inattentive type -     Amphetamine -Dextroamphet ER; Take 1 capsule (30 mg total) by mouth every morning.  Dispense: 30 capsule; Refill: 0 -     Amphetamine -Dextroamphetamine ; Take 1 tablet (20 mg total) by mouth daily.  Dispense: 30 tablet; Refill: 0 -     Amphetamine -Dextroamphet ER; Take 1 capsule (30 mg total) by mouth every morning.  Dispense: 30 capsule; Refill: 0 -     Amphetamine -Dextroamphet ER; Take 1 capsule (30 mg total) by mouth every morning.  Dispense: 30 capsule; Refill: 0 -     Amphetamine -Dextroamphetamine ; Take 1 tablet (20 mg total) by mouth daily.  Dispense: 30 tablet; Refill: 0 -     Amphetamine -Dextroamphetamine ; Take 1 tablet (20 mg total) by mouth daily.  Dispense: 30 tablet; Refill: 0  Obesity (BMI 30-39.9) -     Wegovy ; Inject 0.25 mg into the skin once a week.  Dispense: 2 mL; Refill: 0     I discussed the assessment and treatment plan with the patient. The patient was provided an opportunity to ask questions and all were answered. The patient agreed with the plan and demonstrated an understanding of the instructions. The patient was advised to call back or seek an in-person evaluation if the symptoms worsen or if the condition fails to improve as anticipated.  Assessment and Plan Assessment & Plan Attention deficit hyperactivity disorder, predominantly inattentive type Managed with Adderall. No significant difference between current and previous regimens. - Switch to Adderall 30 mg extended release in the morning and 20 mg immediate release in the afternoon as needed.  Obesity Discussed dietary modifications and introduced Wegovy  for weight management. - Submit prior authorization for Wegovy . - Discuss dietary modifications focusing on reducing carbohydrates and portion control. - Follow up in 3 months to monitor weight and response to Wegovy .  Primary  hypertension Discussed potential medication adjustment with weight loss from Wegovy . - Monitor blood pressure regularly. - Adjust blood pressure medication if lightheadedness or dizziness occurs due to weight loss.  Follow up plan: No follow-ups on file.  Adina Crandall, NP

## 2024-06-19 ENCOUNTER — Telehealth: Payer: Self-pay

## 2024-06-19 ENCOUNTER — Other Ambulatory Visit (HOSPITAL_COMMUNITY): Payer: Self-pay

## 2024-06-19 ENCOUNTER — Encounter: Payer: Self-pay | Admitting: Nurse Practitioner

## 2024-06-19 NOTE — Telephone Encounter (Signed)
 Pharmacy Patient Advocate Encounter   Received notification from CoverMyMeds that prior authorization for Wegovy  0.25MG /0.5ML auto-injectors  is required/requested.   Insurance verification completed.   The patient is insured through [employer] .   Per test claim: PA required; PA started via CoverMyMeds. KEY [id number] . Please see clinical question(s) below that I am not finding the answer to in their chart and advise.

## 2024-06-19 NOTE — Telephone Encounter (Signed)
 I called patient and left a voicemail to be scheduled.

## 2024-06-19 NOTE — Telephone Encounter (Signed)
 Can we call patient and get her current height and weight then calculate BMI and sent to pharmacy tech for PA.

## 2024-06-22 NOTE — Telephone Encounter (Signed)
 Clinical questions have been answered and PA submitted. PA currently Pending.

## 2024-06-22 NOTE — Telephone Encounter (Signed)
 Pharmacy Patient Advocate Encounter  Received notification from [employer] that Prior Authorization for Wegovy  0.25MG /0.5ML auto-injectors  has been DENIED.  Full denial letter will be uploaded to the media tab. See denial reason below.  PLEASE BE ADVISED LETTER OF DENIAL IS IN MEDIA OF CHART [facility] reviewed your WEGOVY  0.25 MG/0.5 ML PEN request for the above-identified member, and it is denied for the following reason: because we did not see what we need to approve the drug you asked for, (Wegovy ). We may be able to approve this drug when we see certain records (documentation that you are currently on and will continue lifestyle modification  including structured nutrition and physical activity, unless physical activity is not clinically appropriate at the time therapy will commence with the requested drug). We based this  decision on your health plan's prior authorization clinical criteria named GLP1s (glucagon-like peptide 1s) for Weight Management.    PA #/Case ID/Reference #: [id number]

## 2024-06-22 NOTE — Telephone Encounter (Signed)
 Contacted pt to get current weight for Wegovy  PA.   Pt's weight is 205 lbs Height is 5'7  BMI: 32.1

## 2024-06-23 NOTE — Telephone Encounter (Signed)
 Called pt and informed her of denial letter. Pt wants to know of other options for weight loss journey. Pt states she was on phentermine and Topamax a couple years ago and just wants to know if she is able to try these medications again.   Scheduled pt's 55month follow for ADHD/Weight

## 2024-06-23 NOTE — Telephone Encounter (Signed)
Can we let the patient know?

## 2024-06-24 NOTE — Telephone Encounter (Signed)
 While being on adderall I would avoid phentermine  She can call and schedule with  [facility]  Address: [address] [place], [place], [place] [address] Hours:  Closes soon ? 5?PM ? Opens 7?AM Tue Confirmed by this business 7 weeks ago Phone: [phone]

## 2024-08-28 ENCOUNTER — Encounter: Payer: Self-pay | Admitting: Nurse Practitioner

## 2024-08-28 NOTE — Telephone Encounter (Signed)
 I think we got something that medical will not pay for dental stuff even if treatment for infection? Is that right? Could she go to urgent care?

## 2024-08-28 NOTE — Telephone Encounter (Signed)
 Called and spoke with patient. Relayed information for needing to be evaluated,  Pt verbalized understanding and states that she will go to UC. Asks if she can just walk in.  Advised pt to keep us  updated on the UC visit.  Pt has no further questions or concerns.

## 2024-08-28 NOTE — Telephone Encounter (Signed)
 She does need to be evaluated. UC will be her best bet

## 2024-08-29 ENCOUNTER — Ambulatory Visit
Admission: EM | Admit: 2024-08-29 | Discharge: 2024-08-29 | Disposition: A | Discharge status: Home / Self Care | Attending: Emergency Medicine | Admitting: Emergency Medicine

## 2024-08-29 DIAGNOSIS — K029 Dental caries, unspecified: Secondary | ICD-10-CM

## 2024-08-29 MED ORDER — CLINDAMYCIN HCL 300 MG PO CAPS: 300.0000 mg | ORAL_CAPSULE | Freq: Three times a day (TID) | ORAL | 0 refills | Status: DC

## 2024-08-29 NOTE — ED Provider Notes (Signed)
 Amy Frye    CSN: [id number] Arrival date & time: 08/29/24  1541      History   Chief Complaint Chief Complaint  Patient presents with   Dental Problem    HPI Amy Frye is a 43 y.o. female.  Patient presents with left lower dental pain x 4 days.  She has been treating her symptoms with Excedrin and ibuprofen.  No fever, chills, purulent drainage, difficulty swallowing, shortness of breath.  The history is provided by the patient and medical records.    Past Medical History:  Diagnosis Date   Heart murmur    Hypertension    Stroke [facility])     Patient Active Problem List   Diagnosis Date Noted   Epigastric pain 03/17/2024   BRBPR (bright red blood per rectum) 03/17/2024   Obesity (BMI 30-39.9) 09/02/2023   Abnormal Pap smear of vagina 02/18/2023   Tobacco use 11/12/2022   Iron deficiency 11/12/2022   Primary hypertension 11/12/2022   High risk medication use 11/12/2022   Overweight 11/12/2022   Raynaud's disease 02/08/2021   PAD (peripheral artery disease) 01/26/2021   Preventative health care 07/18/2020   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 Authorizing Provider  clindamycin  (CLEOCIN ) 300 MG capsule Take 1 capsule (300 mg total) by mouth 3 (three) times daily. 08/29/24  Yes Corlis Burnard DEL, NP  amphetamine -dextroamphetamine  (ADDERALL XR) 30 MG 24 hr capsule Take 1 capsule (30 mg total) by mouth  every morning. 06/18/24   Wendee Lynwood HERO, NP  amphetamine -dextroamphetamine  (ADDERALL XR) 30 MG 24 hr capsule Take 1 capsule (30 mg total) by mouth every morning. 06/18/24   Wendee Lynwood HERO, NP  amphetamine -dextroamphetamine  (ADDERALL XR) 30 MG 24 hr capsule Take 1 capsule (30 mg total) by mouth every morning. 06/18/24   Wendee Lynwood HERO, NP  amphetamine -dextroamphetamine  (ADDERALL) 20 MG tablet Take 1 tablet (20 mg total) by mouth daily. 06/18/24   Wendee Lynwood HERO, NP  amphetamine -dextroamphetamine  (ADDERALL) 20 MG tablet Take 1 tablet (20 mg total) by mouth daily. 06/18/24   Wendee Lynwood HERO, NP  amphetamine -dextroamphetamine  (ADDERALL) 20 MG tablet Take 1 tablet (20 mg total) by mouth daily. 06/18/24   Wendee Lynwood HERO, NP  ferrous sulfate 325 (65 FE) MG tablet Take by mouth.    [provider]  KLOR-CON  M10 10 MEQ tablet TAKE 1 TABLET BY MOUTH EVERY DAY 05/22/24   Wendee Lynwood HERO, NP  loratadine (CLARITIN) 10 MG tablet Take 10 mg by mouth daily.    [provider]  omeprazole  (PRILOSEC) 20 MG capsule Take 1 capsule (20 mg total) by mouth daily. Patient not taking: Reported on 08/29/2024 03/17/24  Wendee Lynwood HERO, NP  potassium chloride  SA (KLOR-CON  M) 20 MEQ tablet Take 2 tablets (40 mEq total) by mouth once for 1 dose. 03/18/24 03/18/24  Wendee Lynwood HERO, NP  semaglutide -weight management (WEGOVY ) 0.25 MG/0.5ML SOAJ SQ injection Inject 0.25 mg into the skin once a week. Patient not taking: Reported on 08/29/2024 06/18/24   Wendee Lynwood HERO, NP  triamterene -hydrochlorothiazide (MAXZIDE-25) 37.5-25 MG tablet TAKE 1 TABLET BY MOUTH EVERY DAY 05/29/24   Wendee Lynwood HERO, NP    Family History Family History  Adopted: Yes    Social History Social History   Tobacco Use   Smoking status: Every Day    Current packs/day: 0.75    Average packs/day: 0.8 packs/day for 26.0 years (19.5 ttl pk-yrs)    Types: Cigarettes   Smokeless tobacco: Never  Vaping Use   Vaping status: Never Used  Substance Use Topics    Alcohol use: Not Currently    Alcohol/week: 10.0 standard drinks of alcohol    Types: 10 Standard drinks or equivalent per week   Drug use: Not Currently     Allergies   Penicillins and Sulfa antibiotics   Review of Systems Review of Systems  Constitutional:  Negative for chills and fever.  HENT:  Positive for dental problem. Negative for sore throat, trouble swallowing and voice change.   Respiratory:  Negative for cough and shortness of breath.      Physical Exam Triage Vital Signs ED Triage Vitals  Encounter Vitals Group     BP      Girls Systolic BP Percentile      Girls Diastolic BP Percentile      Boys Systolic BP Percentile      Boys Diastolic BP Percentile      Pulse      Resp      Temp      Temp src      SpO2      Weight      Height      Head Circumference      Peak Flow      Pain Score      Pain Loc      Pain Education      Exclude from Growth Chart    No data found.  Updated Vital Signs BP 128/85   Pulse 81   Temp 98 F (36.7 C)   Resp 18   LMP 07/30/2023 (Approximate)   SpO2 99%   Visual Acuity Right Eye Distance:   Left Eye Distance:   Bilateral Distance:    Right Eye Near:   Left Eye Near:    Bilateral Near:     Physical Exam Constitutional:      General: She is not in acute distress. HENT:     Mouth/Throat:     Mouth: Mucous membranes are moist.     Dentition: Abnormal dentition. Dental tenderness and dental caries present.     Pharynx: Oropharynx is clear.   Cardiovascular:     Rate and Rhythm: Normal rate.  Pulmonary:     Effort: Pulmonary effort is normal. No respiratory distress.  Neurological:     Mental Status: She is alert.      UC Treatments / Results  Labs (all labs ordered are listed, but only abnormal results are displayed) Labs Reviewed - No data to display  EKG   Radiology No results found.  Procedures Procedures (including critical care time)  Medications Ordered in UC Medications - No data to  display  Initial Impression / Assessment and Plan / UC Course  I have reviewed the triage vital signs and the nursing notes.  Pertinent labs & imaging results that were available during my care of the patient were reviewed by me and considered in my medical decision making (see chart for details).    Dental pain due to dental caries.  Afebrile and vital signs are stable.  Patient is allergic to penicillin and sulfa.  Treating today with clindamycin  x 7 days.  Instructed patient to schedule an appointment with a dentist to soon as possible.  Dental resource guide provided.  Education provided on dental pain.  ED precautions given.  She agrees to plan of care.  Final Clinical Impressions(s) / UC Diagnoses   Final diagnoses:  Pain due to dental caries     Discharge Instructions      Take the antibiotic as prescribed.    A dental resource guide is attached.  Please call to make an appointment with a dentist as soon as possible.    Go to the emergency department if you have worsening symptoms.         ED Prescriptions     Medication Sig Dispense Auth. Provider   clindamycin  (CLEOCIN ) 300 MG capsule Take 1 capsule (300 mg total) by mouth 3 (three) times daily. 21 capsule Corlis Burnard DEL, NP      I have reviewed the PDMP during this encounter.   Corlis Burnard DEL, NP 08/29/24 1600

## 2024-08-29 NOTE — Discharge Instructions (Signed)
Take the antibiotic as prescribed.    A dental resource guide is attached.  Please call to make an appointment with a dentist as soon as possible.    Go to the emergency department if you have worsening symptoms.

## 2024-08-29 NOTE — ED Triage Notes (Signed)
 Patient to Urgent Care with complaints of left lower dental pain and swelling. Pain radiates into her ear and upper jaw.   Symptoms started Tuesday. Attempted to contact her dentist but he has retired and scientific laboratory technician.   Using ice/ excedrin/ ibuprofen

## 2024-09-23 ENCOUNTER — Ambulatory Visit: Admitting: Nurse Practitioner

## 2024-11-04 ENCOUNTER — Ambulatory Visit: Admitting: Nurse Practitioner

## 2024-11-04 VITALS — BP 126/72 | HR 105 | Temp 98.3°F | Ht 67.0 in | Wt 212.4 lb

## 2024-11-04 DIAGNOSIS — F900 Attention-deficit hyperactivity disorder, predominantly inattentive type: Secondary | ICD-10-CM

## 2024-11-04 DIAGNOSIS — E669 Obesity, unspecified: Secondary | ICD-10-CM | POA: Diagnosis not present

## 2024-11-04 MED ORDER — AMPHETAMINE-DEXTROAMPHETAMINE 20 MG PO TABS: 20.0000 mg | ORAL_TABLET | Freq: Every day | ORAL | 0 refills | Status: AC

## 2024-11-04 MED ORDER — AMPHETAMINE-DEXTROAMPHET ER 30 MG PO CP24: 30.0000 mg | ORAL_CAPSULE | ORAL | 0 refills | Status: AC

## 2024-11-04 MED ORDER — WEGOVY 0.25 MG/0.5ML ~~LOC~~ SOAJ: 0.2500 mg | SUBCUTANEOUS | 0 refills | Status: AC

## 2024-11-04 NOTE — Patient Instructions (Signed)
 Nice to see you today I want to see you in 3 months if you are able to get the weight loss medication  6 months if not

## 2024-11-04 NOTE — Progress Notes (Signed)
 "  Established Patient Office Visit  Subjective   Patient ID: Amy Frye, female    DOB: 12-Mar-1981  Age: 44 y.o. MRN: [id number]  Chief Complaint  Patient presents with   Follow-up    HPI  Discussed the use of AI scribe software for clinical note transcription with the patient, who gave verbal consent to proceed.  History of Present Illness Amy Frye is a 44 year old female who presents for follow-up on ADHD medication and discussion of weight loss options.  She is currently taking Adderall for ADHD. She experiences no fever, chills, chest pain, shortness of breath, palpitations, or difficulty sleeping due to the medication. However, she sometimes feels lightheaded when she skips meals.  She is considering weight loss options but has not proceeded with a weight loss injection due to insurance requirements. Her diet consists of two meals a day, often skipping breakfast, and she occasionally snacks on items like cookies and chips. She eats dinner late, around 9 PM, and goes to bed around 2-3 AM. She drinks some water but consumes one to one and a half bottled sodas daily.  In terms of physical activity, she reports less structured exercise during the winter but stays active by taking care of animals and working in her garden. She does not engage in resistance training or body weight exercises.    Review of Systems  Constitutional:  Negative for chills and fever.  Respiratory:  Negative for shortness of breath.   Cardiovascular:  Negative for chest pain.  Neurological:  Negative for headaches.  Psychiatric/Behavioral:  Negative for hallucinations and suicidal ideas.       Objective:     BP 126/72   Pulse (!) 105   Temp 98.3 F (36.8 C) (Oral)   Ht 5' 7 (1.702 m)   Wt 212 lb 6.4 oz (96.3 kg)   LMP 07/30/2023   SpO2 95%   BMI 33.27 kg/m  BP Readings from Last 3 Encounters:  11/04/24 126/72  08/29/24 128/85  03/17/24 116/80   Wt Readings from Last 3  Encounters:  11/04/24 212 lb 6.4 oz (96.3 kg)  03/17/24 202 lb 6.4 oz (91.8 kg)  09/02/23 201 lb 6.4 oz (91.4 kg)   SpO2 Readings from Last 3 Encounters:  11/04/24 95%  08/29/24 99%  03/17/24 92%      Physical Exam Vitals and nursing note reviewed.  Constitutional:      Appearance: Normal appearance.  Cardiovascular:     Rate and Rhythm: Normal rate and regular rhythm.     Heart sounds: Normal heart sounds.  Pulmonary:     Effort: Pulmonary effort is normal.     Breath sounds: Normal breath sounds.  Abdominal:     General: Bowel sounds are normal.  Neurological:     Mental Status: She is alert.      No results found for any visits on 11/04/24.    The ASCVD Risk score (Arnett DK, et al., 2019) failed to calculate for the following reasons:   Risk score cannot be calculated because patient has a medical history suggesting prior/existing ASCVD   * - Cholesterol units were assumed    Assessment & Plan:   Problem List Items Addressed This Visit       Other   Attention deficit hyperactivity disorder (ADHD), predominantly inattentive type   Relevant Medications   amphetamine -dextroamphetamine  (ADDERALL XR) 30 MG 24 hr capsule   amphetamine -dextroamphetamine  (ADDERALL XR) 30 MG 24 hr capsule   amphetamine -dextroamphetamine  (ADDERALL  XR) 30 MG 24 hr capsule   amphetamine -dextroamphetamine  (ADDERALL) 20 MG tablet   amphetamine -dextroamphetamine  (ADDERALL) 20 MG tablet   amphetamine -dextroamphetamine  (ADDERALL) 20 MG tablet   Obesity (BMI 30-39.9) - Primary   Relevant Medications   semaglutide -weight management (WEGOVY ) 0.25 MG/0.5ML SOAJ SQ injection   amphetamine -dextroamphetamine  (ADDERALL XR) 30 MG 24 hr capsule   amphetamine -dextroamphetamine  (ADDERALL XR) 30 MG 24 hr capsule   amphetamine -dextroamphetamine  (ADDERALL XR) 30 MG 24 hr capsule   amphetamine -dextroamphetamine  (ADDERALL) 20 MG tablet   amphetamine -dextroamphetamine  (ADDERALL) 20 MG tablet    amphetamine -dextroamphetamine  (ADDERALL) 20 MG tablet   Assessment and Plan Assessment & Plan Obesity Discussed management focusing on diet and exercise. Considered weight loss medications, injectable options require insurance approval, oral options are cheaper but new.  Qsymia not suitable due to Adderall use. - Resubmitted request for injectable weight loss medication to insurance. - Consider oral weight loss medication if injectable is not covered or affordable. - Encouraged dietary changes, including reducing soda intake and portion control. - Advised on healthy snacking options, such as nuts and vegetables.  Attention deficit hyperactivity disorder, predominantly inattentive type ADHD managed with Adderall, no side effects reported. - Sent prescription for Adderall refill to pharmacy.  Primary hypertension Blood pressure well-controlled with current medication.  Return in about 3 months (around 02/02/2025) for weight recheck .    Adina Crandall, NP  "

## 2024-11-10 ENCOUNTER — Other Ambulatory Visit (HOSPITAL_COMMUNITY): Payer: Self-pay

## 2024-11-16 ENCOUNTER — Encounter: Payer: Self-pay | Admitting: Nurse Practitioner

## 2024-11-16 ENCOUNTER — Other Ambulatory Visit (HOSPITAL_COMMUNITY): Payer: Self-pay

## 2024-11-16 ENCOUNTER — Telehealth: Payer: Self-pay | Admitting: Pharmacy Technician

## 2024-11-16 NOTE — Telephone Encounter (Signed)
 Pharmacy Patient Advocate Encounter   Received notification from [employer] that prior authorization for Wegovy  0.25MG /0.5ML auto-injectors  is required/requested.   Insurance verification completed.   The patient is insured through [employer].   Per test claim: PA required; PA submitted to above mentioned insurance via Latent Key/confirmation #/EOC [id number] Status is pending

## 2024-11-16 NOTE — Telephone Encounter (Signed)
 Pharmacy Patient Advocate Encounter  Received notification from [employer] that Prior Authorization for Wegovy  0.25mg /0.72ml has been APPROVED from 11/16/24 to 05/15/25   PA #/Case ID/Reference #: [id number]  Approval letter indexed to media tab

## 2025-02-03 ENCOUNTER — Ambulatory Visit: Admitting: Nurse Practitioner
